# Patient Record
Sex: Female | Born: 1970 | Race: White | Hispanic: No | Marital: Single | Smoking: Never smoker
Health system: Southern US, Community
[De-identification: ages and names within clinical notes are randomized; demographics above are authoritative.]

## PROBLEM LIST (undated history)

## (undated) DIAGNOSIS — H9190 Unspecified hearing loss, unspecified ear: Secondary | ICD-10-CM

## (undated) DIAGNOSIS — E119 Type 2 diabetes mellitus without complications: Secondary | ICD-10-CM

## (undated) DIAGNOSIS — N289 Disorder of kidney and ureter, unspecified: Secondary | ICD-10-CM

---

## 2008-10-26 ENCOUNTER — Emergency Department (HOSPITAL_COMMUNITY): Admission: EM | Admit: 2008-10-26 | Discharge: 2008-10-27 | Payer: Self-pay | Admitting: Emergency Medicine

## 2008-11-14 ENCOUNTER — Emergency Department (HOSPITAL_COMMUNITY): Admission: EM | Admit: 2008-11-14 | Discharge: 2008-11-14 | Payer: Self-pay | Admitting: Emergency Medicine

## 2009-02-10 ENCOUNTER — Ambulatory Visit: Payer: Self-pay | Admitting: Internal Medicine

## 2009-02-10 ENCOUNTER — Inpatient Hospital Stay (HOSPITAL_COMMUNITY): Admission: EM | Admit: 2009-02-10 | Discharge: 2009-02-15 | Payer: Self-pay | Admitting: Emergency Medicine

## 2009-02-11 ENCOUNTER — Encounter (INDEPENDENT_AMBULATORY_CARE_PROVIDER_SITE_OTHER): Payer: Self-pay | Admitting: Internal Medicine

## 2009-02-16 ENCOUNTER — Telehealth: Payer: Self-pay | Admitting: Internal Medicine

## 2009-02-19 ENCOUNTER — Telehealth: Payer: Self-pay | Admitting: Internal Medicine

## 2009-02-22 ENCOUNTER — Ambulatory Visit: Payer: Self-pay | Admitting: Internal Medicine

## 2009-02-22 DIAGNOSIS — I10 Essential (primary) hypertension: Secondary | ICD-10-CM

## 2009-02-22 DIAGNOSIS — I5032 Chronic diastolic (congestive) heart failure: Secondary | ICD-10-CM | POA: Insufficient documentation

## 2009-02-22 DIAGNOSIS — K5900 Constipation, unspecified: Secondary | ICD-10-CM | POA: Insufficient documentation

## 2009-02-22 DIAGNOSIS — I319 Disease of pericardium, unspecified: Secondary | ICD-10-CM | POA: Insufficient documentation

## 2009-02-22 DIAGNOSIS — E1129 Type 2 diabetes mellitus with other diabetic kidney complication: Secondary | ICD-10-CM | POA: Insufficient documentation

## 2009-02-23 ENCOUNTER — Telehealth: Payer: Self-pay | Admitting: Internal Medicine

## 2009-02-25 ENCOUNTER — Encounter: Payer: Self-pay | Admitting: Internal Medicine

## 2009-03-04 ENCOUNTER — Telehealth: Payer: Self-pay | Admitting: Internal Medicine

## 2009-03-04 LAB — CONVERTED CEMR LAB
BUN: 47 mg/dL — ABNORMAL HIGH (ref 6–23)
Calcium: 9.1 mg/dL (ref 8.4–10.5)
Chloride: 99 meq/L (ref 96–112)
Creatinine, Ser: 2.4 mg/dL — ABNORMAL HIGH (ref 0.4–1.2)
GFR calc non Af Amer: 23.98 mL/min (ref >60)
Glucose, Bld: 293 mg/dL — ABNORMAL HIGH (ref 70–99)
Pro B Natriuretic peptide (BNP): 121 pg/mL — ABNORMAL HIGH (ref 0.0–100.0)

## 2009-03-05 ENCOUNTER — Inpatient Hospital Stay (HOSPITAL_COMMUNITY): Admission: EM | Admit: 2009-03-05 | Discharge: 2009-03-11 | Payer: Self-pay | Admitting: Emergency Medicine

## 2009-03-05 ENCOUNTER — Emergency Department (HOSPITAL_COMMUNITY): Admission: EM | Admit: 2009-03-05 | Discharge: 2009-03-05 | Payer: Self-pay | Admitting: Emergency Medicine

## 2009-03-07 ENCOUNTER — Encounter: Payer: Self-pay | Admitting: Internal Medicine

## 2009-03-10 ENCOUNTER — Encounter (INDEPENDENT_AMBULATORY_CARE_PROVIDER_SITE_OTHER): Payer: Self-pay | Admitting: Orthopedic Surgery

## 2009-03-10 ENCOUNTER — Encounter: Payer: Self-pay | Admitting: Internal Medicine

## 2009-03-13 ENCOUNTER — Emergency Department (HOSPITAL_COMMUNITY): Admission: EM | Admit: 2009-03-13 | Discharge: 2009-03-13 | Payer: Self-pay | Admitting: Emergency Medicine

## 2009-04-02 ENCOUNTER — Inpatient Hospital Stay (HOSPITAL_COMMUNITY): Admission: EM | Admit: 2009-04-02 | Discharge: 2009-04-08 | Payer: Self-pay | Admitting: Emergency Medicine

## 2009-04-03 ENCOUNTER — Encounter (INDEPENDENT_AMBULATORY_CARE_PROVIDER_SITE_OTHER): Payer: Self-pay

## 2009-04-06 ENCOUNTER — Ambulatory Visit: Payer: Self-pay | Admitting: Physical Medicine & Rehabilitation

## 2009-07-28 ENCOUNTER — Ambulatory Visit: Payer: Self-pay | Admitting: Internal Medicine

## 2009-08-02 ENCOUNTER — Ambulatory Visit: Payer: Self-pay | Admitting: Internal Medicine

## 2009-08-05 ENCOUNTER — Encounter: Payer: Self-pay | Admitting: Internal Medicine

## 2009-08-12 ENCOUNTER — Encounter: Payer: Self-pay | Admitting: Internal Medicine

## 2010-04-21 IMAGING — CR DG FOOT COMPLETE 3+V*L*
3 series · 3 of 3 positions shown · non-contrast
Comparison: 03/05/2009

CLINICAL DATA: Drainage post surgery

LEFT FOOT - COMPLETE 3+ VIEW

[t foot ap left]
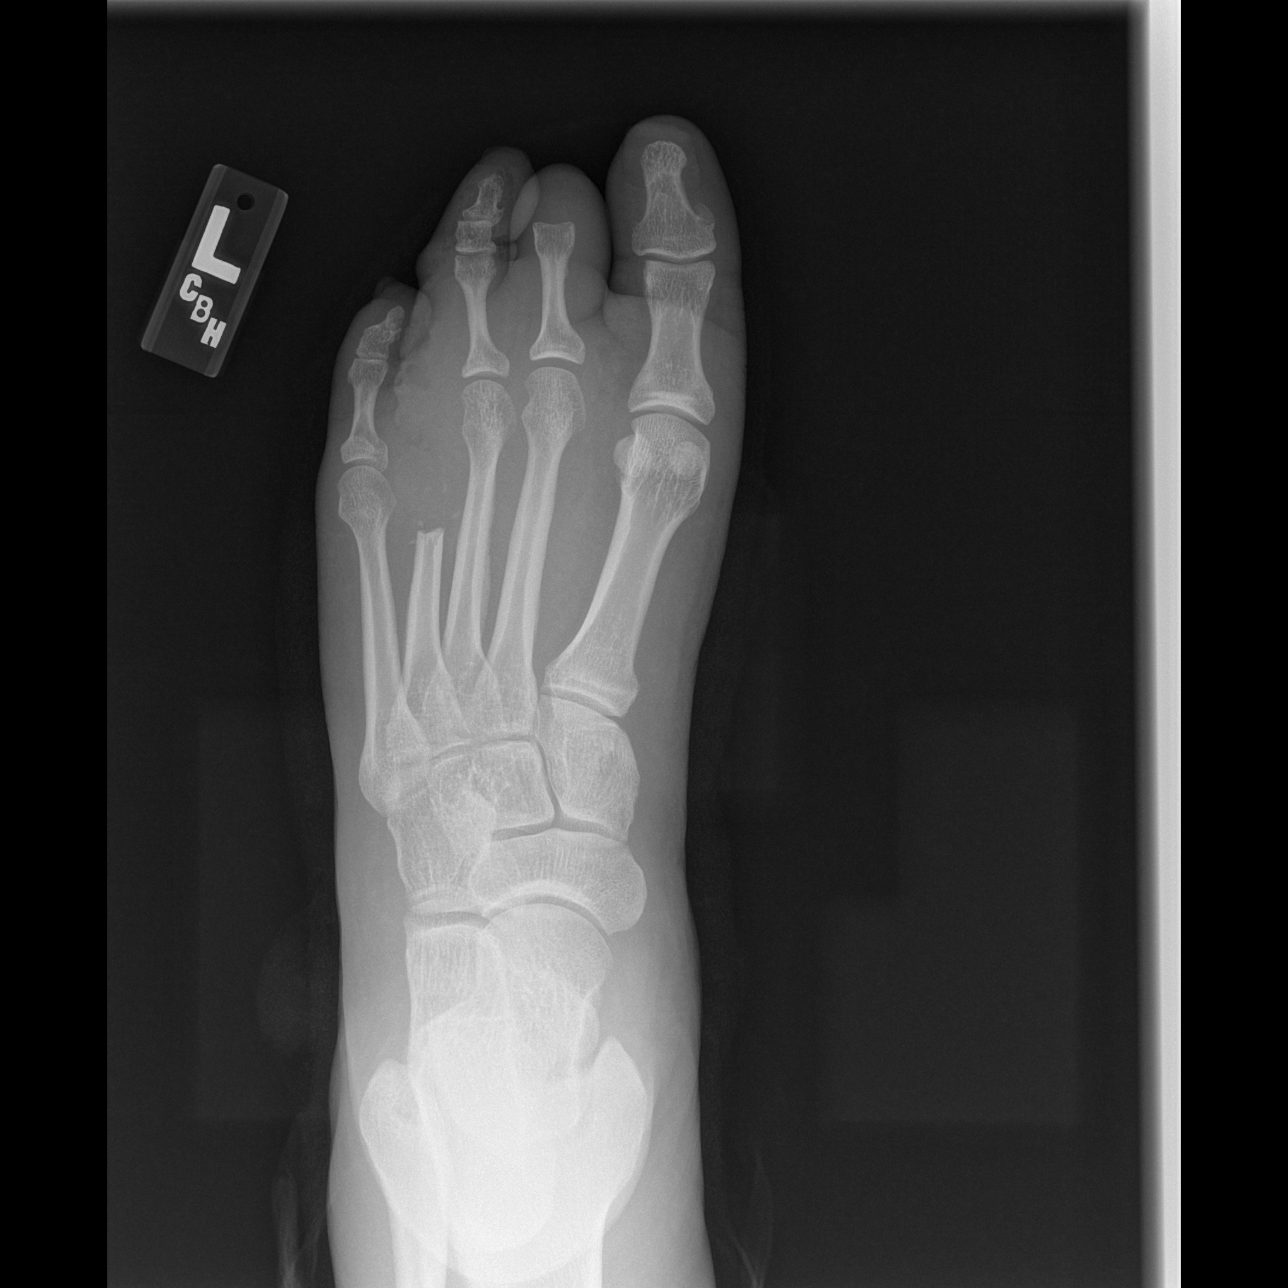

[t foot oblique left]
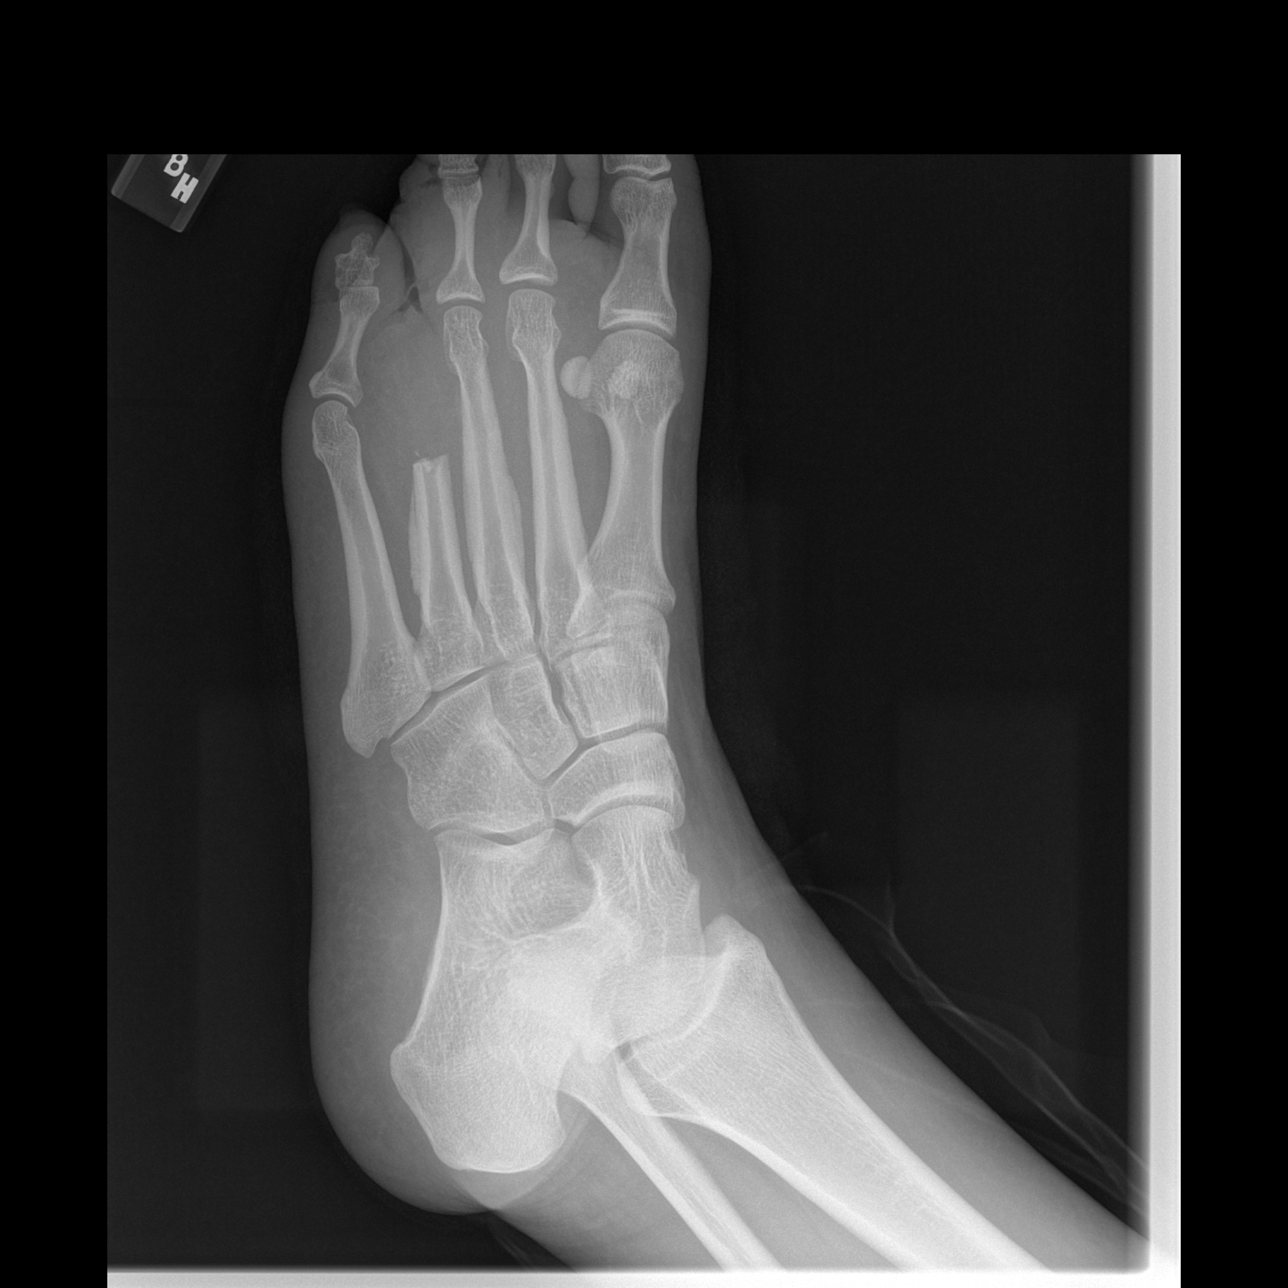

[t foot lat left]
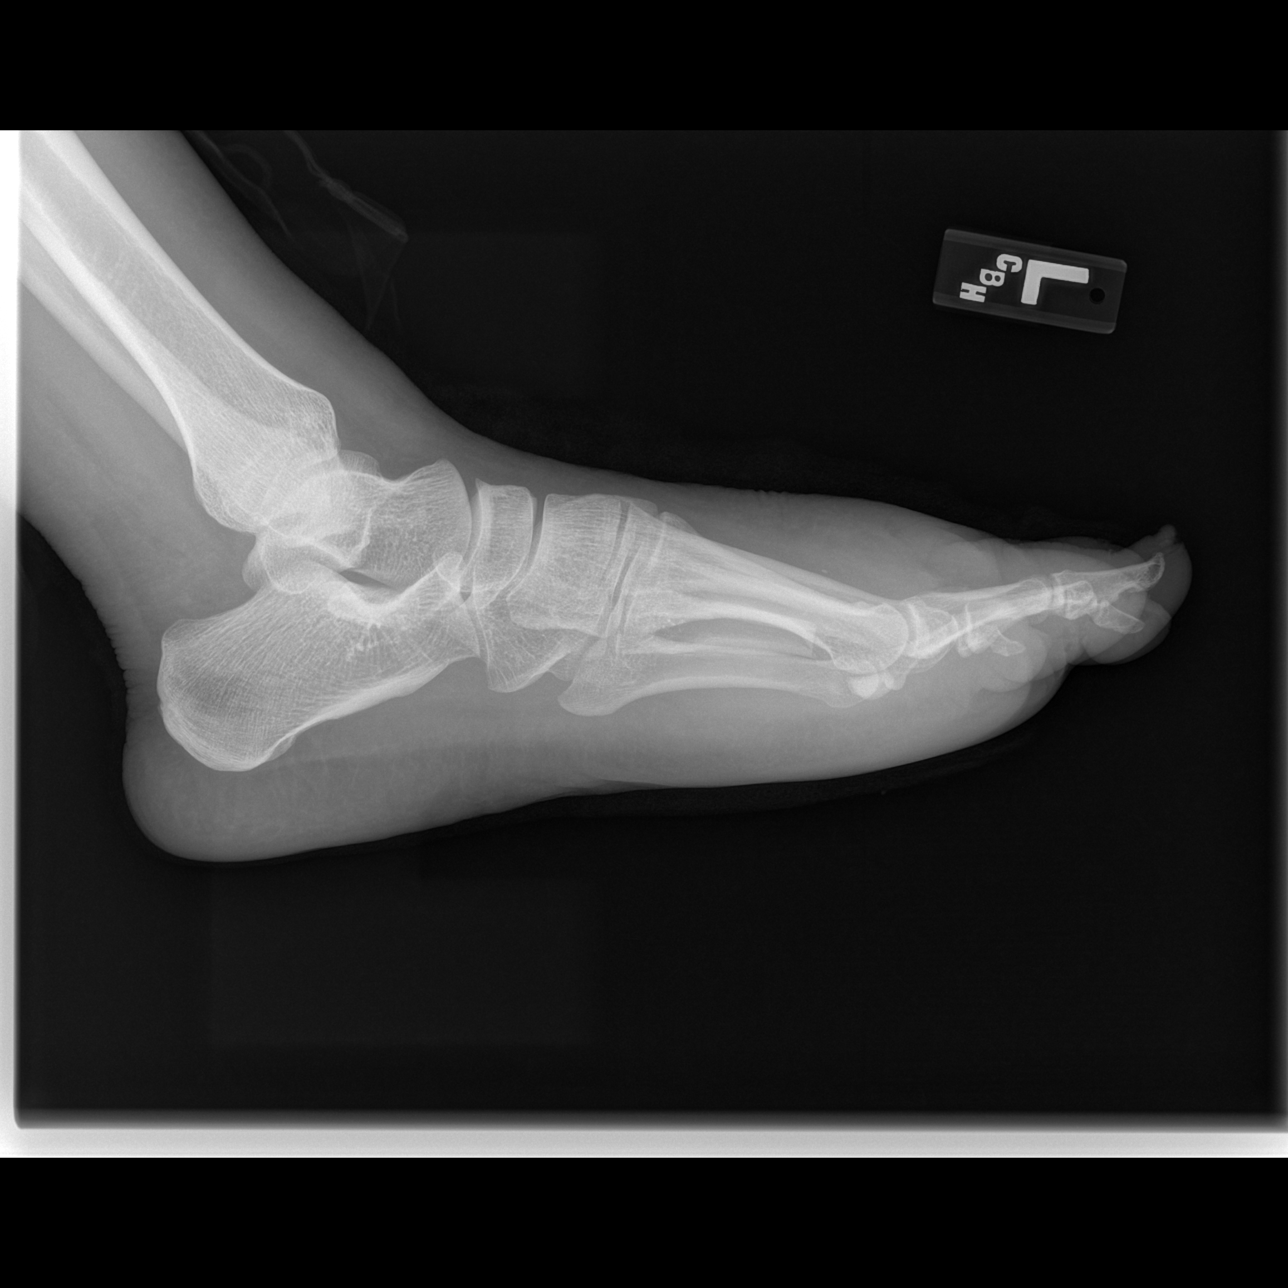

[3 of 3 positions shown; findings below may reference images not displayed]

FINDINGS: There is been interval amputation across the distal shaft
of the fourth metatarsal.  There are tiny osseous fragments distal
to the resection site.  No subcutaneous gas or foreign body.  There
is regional soft tissue swelling.  Normal alignment and
mineralization.  Stable changes of amputation across the second toe
PIP.
IMPRESSION: 1.  Amputation across the distal fourth metatarsal shaft without
subcutaneous gas or other apparent complication.

## 2010-04-25 IMAGING — US US RENAL
1 series · 14 of 23 positions shown · non-contrast
Comparison: None

CLINICAL DATA: Elevated BUN and creatinine.

RENAL/URINARY TRACT ULTRASOUND COMPLETE

[Series 1: us renal · 0.32mm/px · 14 of 23 slices shown]
[im 1/23]
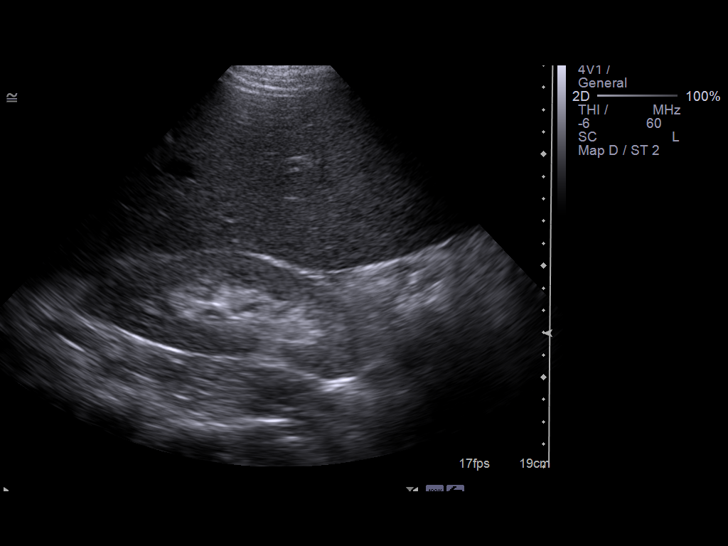
[im 3/23]
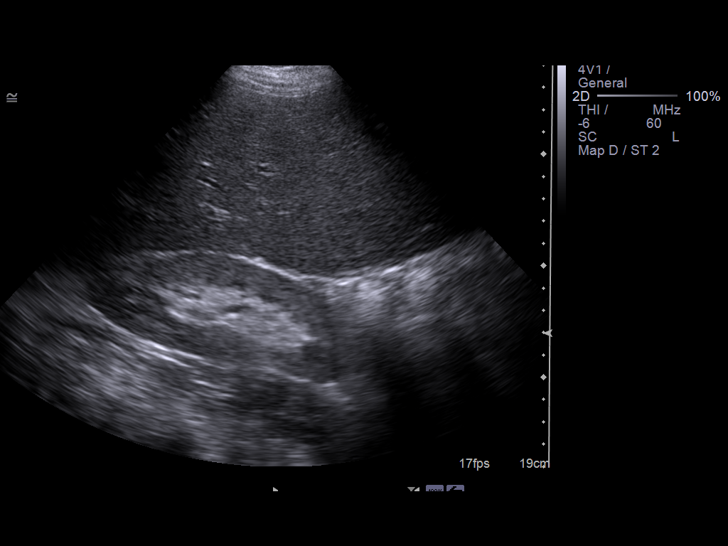
[im 5/23]
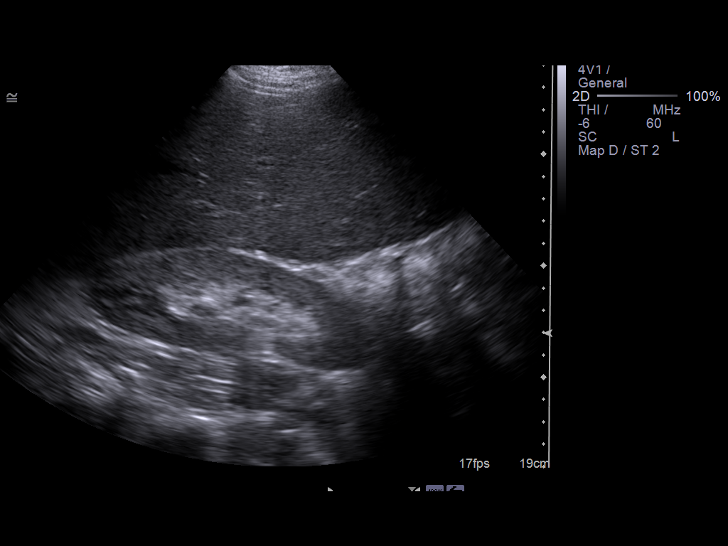
[im 6/23]
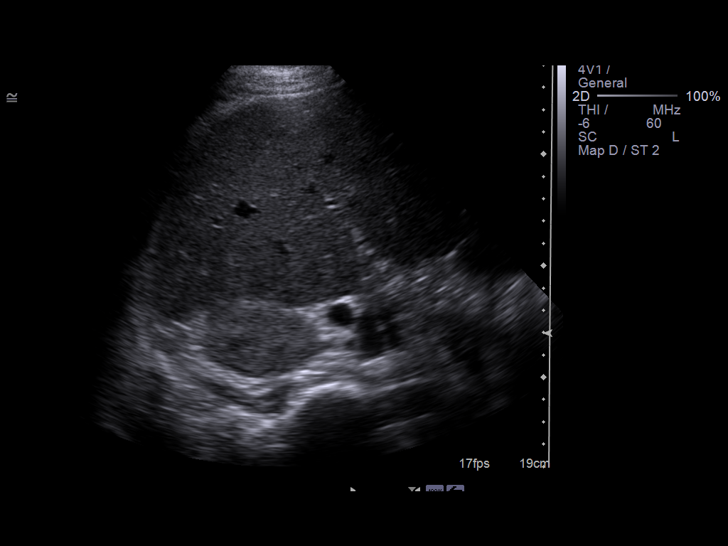
[im 8/23]
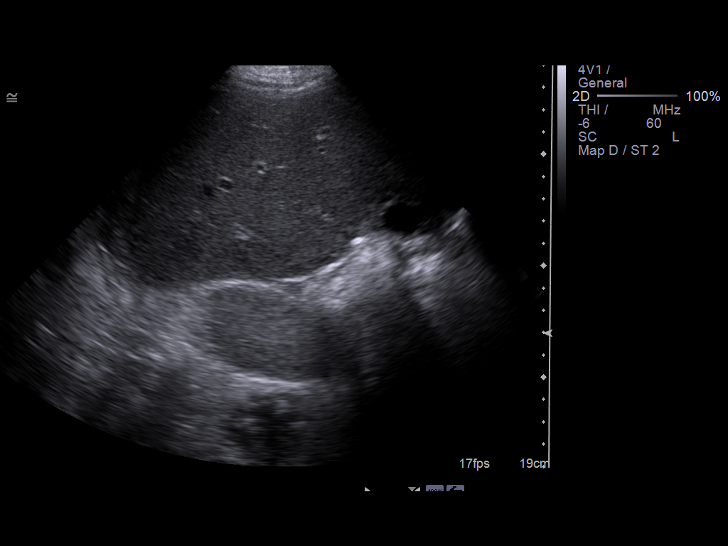
[im 10/23]
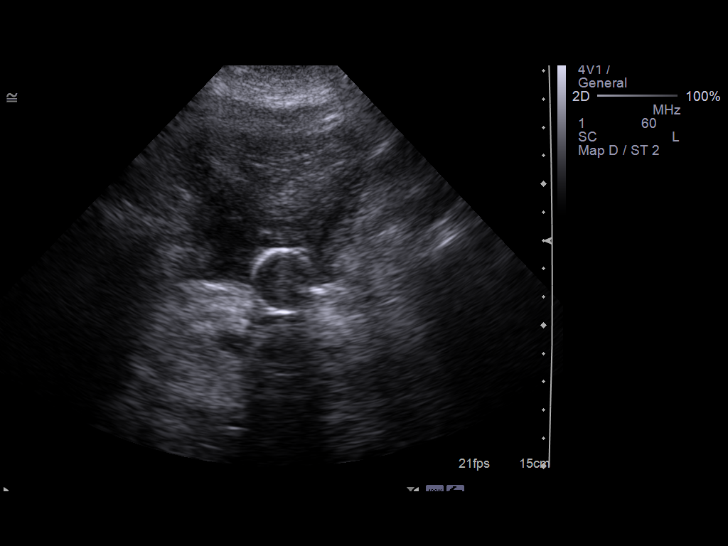
[im 11/23]
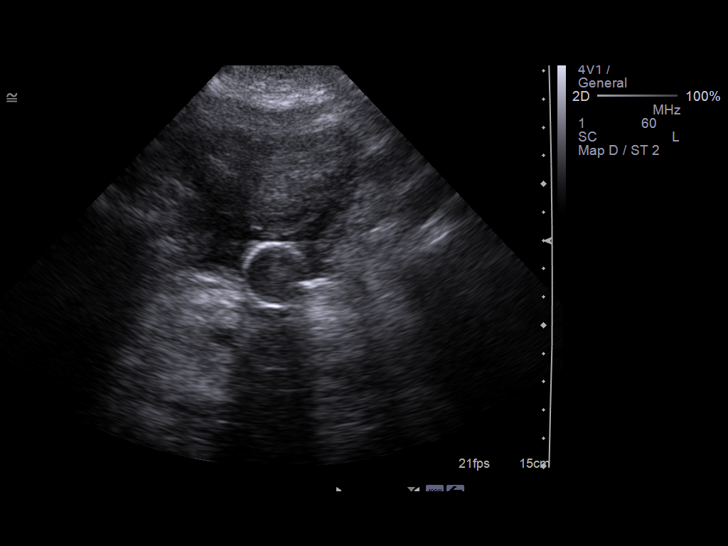
[im 13/23]
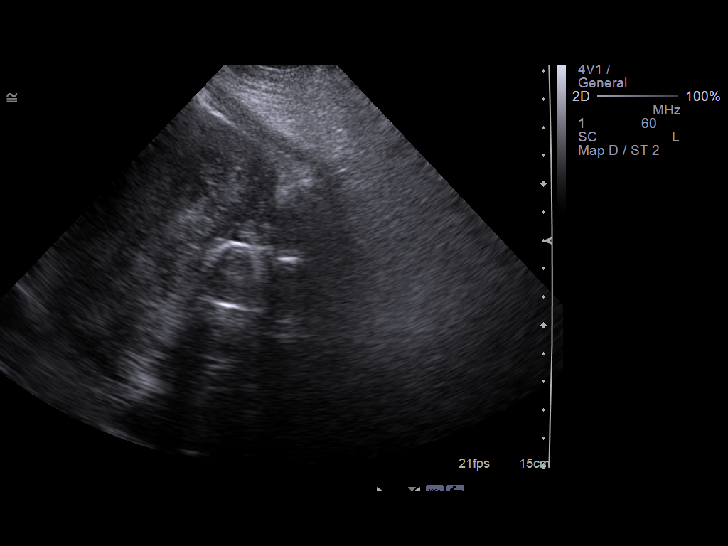
[im 14/23]
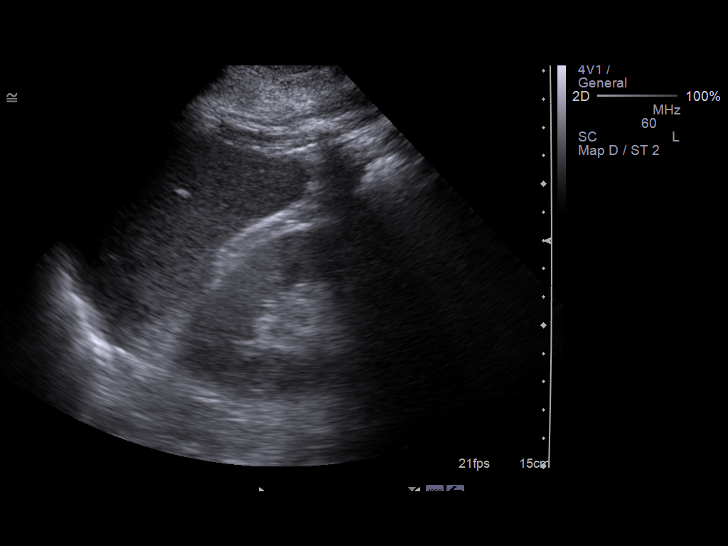
[im 16/23]
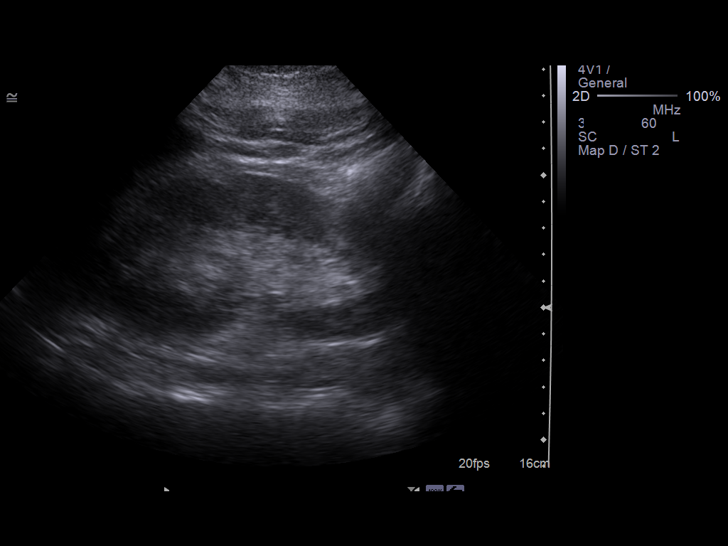
[im 18/23]
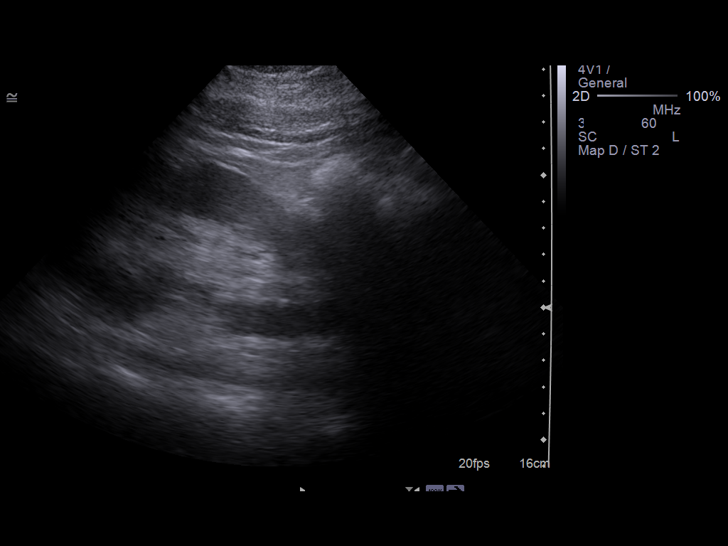
[im 19/23]
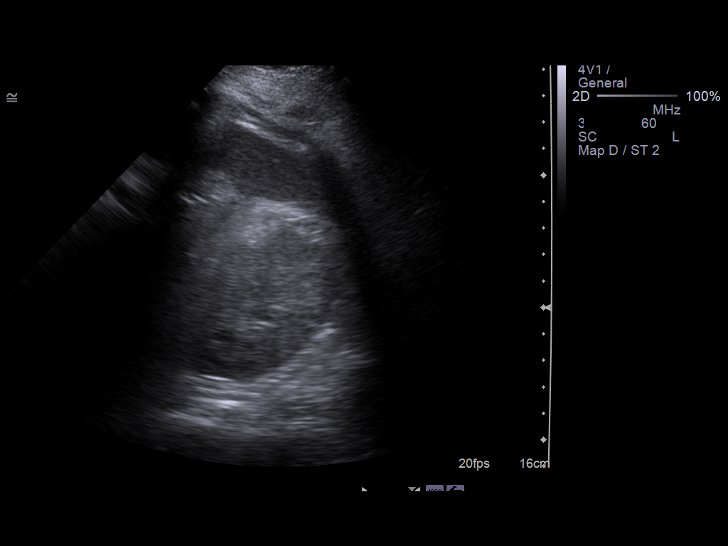
[im 21/23]
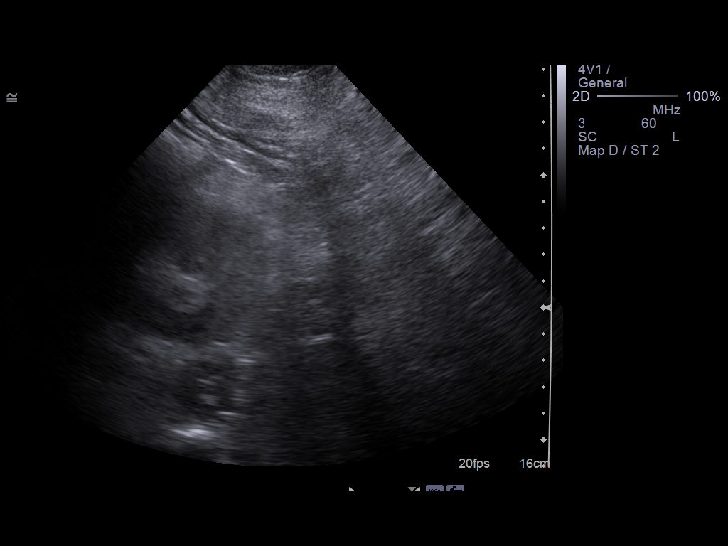
[im 23/23]
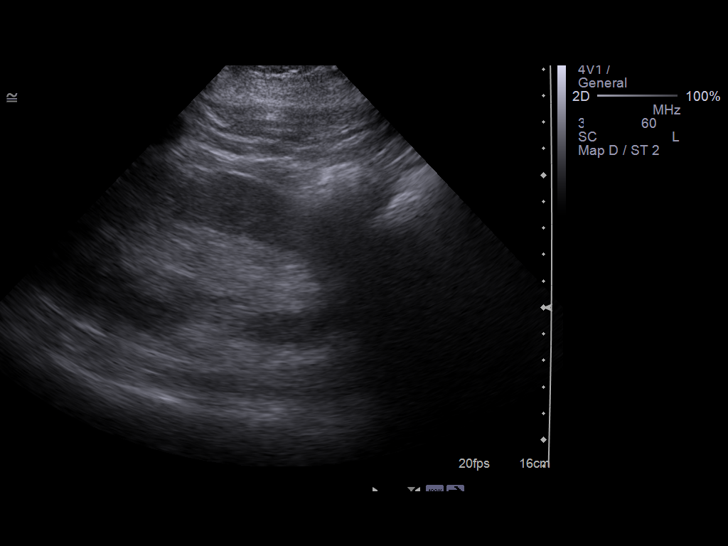

[14 of 23 positions shown; findings below may reference images not displayed]

FINDINGS: Right Kidney:  12.5 cm in length.  Normal renal cortical thickness
with slight diffuse increased echogenicity which may suggest
medical renal disease. No focal lesions or hydronephrosis.

Left Kidney:  12.5 cm in length.  Normal renal cortical thickness
and slight increased echogenicity which may suggest medical renal
disease.  No focal lesions or hydronephrosis.

Bladder:  Decompressed by a Foley catheter.
IMPRESSION: 1.  Normal renal size and renal cortical thickness.
2.  Slight increased echogenicity of both kidneys may suggest
medical renal disease.
3.  No focal lesions or hydronephrosis.

## 2010-05-31 NOTE — Miscellaneous (Signed)
Summary: [facility] Orders  [facility] Orders   Imported By: Roderic Ovens 08/10/2009 14:15:01  _____________________________________________________________________  External Attachment:    Type:   Image     Comment:   External Document

## 2010-05-31 NOTE — Miscellaneous (Signed)
Summary: Plan/[facility]  Plan/[facility]   Imported By: Lester [place] 08/02/2009 07:52:51  _____________________________________________________________________  External Attachment:    Type:   Image     Comment:   External Document

## 2010-05-31 NOTE — Miscellaneous (Signed)
Summary: [facility] Orders  [facility]   Imported By: Roderic Ovens 08/25/2009 16:39:34  _____________________________________________________________________  External Attachment:    Type:   Image     Comment:   External Document

## 2010-05-31 NOTE — Miscellaneous (Signed)
Summary: Plan of Care & Treatment/[facility]  Plan of Care & Treatment/[facility]   Imported By: Sherian Rein 08/04/2009 10:29:26  _____________________________________________________________________  External Attachment:    Type:   Image     Comment:   External Document

## 2010-08-02 LAB — URINALYSIS, ROUTINE W REFLEX MICROSCOPIC
Bilirubin Urine: NEGATIVE
Nitrite: NEGATIVE
Specific Gravity, Urine: 1.023 (ref 1.005–1.030)
pH: 5.5 (ref 5.0–8.0)

## 2010-08-02 LAB — GLUCOSE, CAPILLARY
Glucose-Capillary: 100 mg/dL — ABNORMAL HIGH (ref 70–99)
Glucose-Capillary: 107 mg/dL — ABNORMAL HIGH (ref 70–99)
Glucose-Capillary: 107 mg/dL — ABNORMAL HIGH (ref 70–99)
Glucose-Capillary: 111 mg/dL — ABNORMAL HIGH (ref 70–99)
Glucose-Capillary: 113 mg/dL — ABNORMAL HIGH (ref 70–99)
Glucose-Capillary: 120 mg/dL — ABNORMAL HIGH (ref 70–99)
Glucose-Capillary: 124 mg/dL — ABNORMAL HIGH (ref 70–99)
Glucose-Capillary: 131 mg/dL — ABNORMAL HIGH (ref 70–99)
Glucose-Capillary: 137 mg/dL — ABNORMAL HIGH (ref 70–99)
Glucose-Capillary: 150 mg/dL — ABNORMAL HIGH (ref 70–99)
Glucose-Capillary: 158 mg/dL — ABNORMAL HIGH (ref 70–99)
Glucose-Capillary: 161 mg/dL — ABNORMAL HIGH (ref 70–99)
Glucose-Capillary: 162 mg/dL — ABNORMAL HIGH (ref 70–99)
Glucose-Capillary: 165 mg/dL — ABNORMAL HIGH (ref 70–99)
Glucose-Capillary: 184 mg/dL — ABNORMAL HIGH (ref 70–99)
Glucose-Capillary: 51 mg/dL — ABNORMAL LOW (ref 70–99)
Glucose-Capillary: 85 mg/dL (ref 70–99)
Glucose-Capillary: 95 mg/dL (ref 70–99)

## 2010-08-02 LAB — POCT I-STAT, CHEM 8
Calcium, Ion: 1 mmol/L — ABNORMAL LOW (ref 1.12–1.32)
Chloride: 105 mEq/L (ref 96–112)

## 2010-08-02 LAB — RETICULOCYTES
RBC.: 2.97 MIL/uL — ABNORMAL LOW (ref 3.87–5.11)
Retic Count, Absolute: 17.8 10*3/uL — ABNORMAL LOW (ref 19.0–186.0)

## 2010-08-02 LAB — CBC
HCT: 23 % — ABNORMAL LOW (ref 36.0–46.0)
HCT: 24.7 % — ABNORMAL LOW (ref 36.0–46.0)
HCT: 25 % — ABNORMAL LOW (ref 36.0–46.0)
HCT: 25.6 % — ABNORMAL LOW (ref 36.0–46.0)
HCT: 26 % — ABNORMAL LOW (ref 36.0–46.0)
Hemoglobin: 7.8 g/dL — ABNORMAL LOW (ref 12.0–15.0)
Hemoglobin: 8.4 g/dL — ABNORMAL LOW (ref 12.0–15.0)
Hemoglobin: 8.5 g/dL — ABNORMAL LOW (ref 12.0–15.0)
Hemoglobin: 8.5 g/dL — ABNORMAL LOW (ref 12.0–15.0)
MCHC: 33.1 g/dL (ref 30.0–36.0)
MCHC: 33.1 g/dL (ref 30.0–36.0)
MCHC: 33.8 g/dL (ref 30.0–36.0)
MCHC: 33.8 g/dL (ref 30.0–36.0)
MCHC: 34.1 g/dL (ref 30.0–36.0)
MCHC: 34.5 g/dL (ref 30.0–36.0)
MCV: 79.8 fL (ref 78.0–100.0)
MCV: 80.4 fL (ref 78.0–100.0)
MCV: 80.8 fL (ref 78.0–100.0)
MCV: 81.7 fL (ref 78.0–100.0)
MCV: 83.7 fL (ref 78.0–100.0)
MCV: 84.5 fL (ref 78.0–100.0)
MCV: 84.7 fL (ref 78.0–100.0)
Platelets: 379 10*3/uL (ref 150–400)
Platelets: 390 10*3/uL (ref 150–400)
RBC: 2.85 MIL/uL — ABNORMAL LOW (ref 3.87–5.11)
RBC: 2.97 MIL/uL — ABNORMAL LOW (ref 3.87–5.11)
RBC: 3.03 MIL/uL — ABNORMAL LOW (ref 3.87–5.11)
RBC: 3.1 MIL/uL — ABNORMAL LOW (ref 3.87–5.11)
RBC: 3.41 MIL/uL — ABNORMAL LOW (ref 3.87–5.11)
RDW: 14.8 % (ref 11.5–15.5)
RDW: 14.9 % (ref 11.5–15.5)
RDW: 14.9 % (ref 11.5–15.5)
RDW: 14.9 % (ref 11.5–15.5)
WBC: 13.8 10*3/uL — ABNORMAL HIGH (ref 4.0–10.5)
WBC: 14.6 10*3/uL — ABNORMAL HIGH (ref 4.0–10.5)
WBC: 16 10*3/uL — ABNORMAL HIGH (ref 4.0–10.5)
WBC: 22.9 10*3/uL — ABNORMAL HIGH (ref 4.0–10.5)

## 2010-08-02 LAB — RENAL FUNCTION PANEL
Albumin: 1.8 g/dL — ABNORMAL LOW (ref 3.5–5.2)
CO2: 22 mEq/L (ref 19–32)
CO2: 22 mEq/L (ref 19–32)
CO2: 22 mEq/L (ref 19–32)
Calcium: 6.9 mg/dL — ABNORMAL LOW (ref 8.4–10.5)
Calcium: 7 mg/dL — ABNORMAL LOW (ref 8.4–10.5)
Chloride: 110 mEq/L (ref 96–112)
Chloride: 111 mEq/L (ref 96–112)
Chloride: 113 mEq/L — ABNORMAL HIGH (ref 96–112)
Creatinine, Ser: 2.99 mg/dL — ABNORMAL HIGH (ref 0.4–1.2)
GFR calc Af Amer: 19 mL/min — ABNORMAL LOW (ref >60)
GFR calc Af Amer: 21 mL/min — ABNORMAL LOW (ref >60)
GFR calc Af Amer: 22 mL/min — ABNORMAL LOW (ref >60)
GFR calc non Af Amer: 16 mL/min — ABNORMAL LOW (ref >60)
GFR calc non Af Amer: 18 mL/min — ABNORMAL LOW (ref >60)
GFR calc non Af Amer: 18 mL/min — ABNORMAL LOW (ref >60)
Glucose, Bld: 102 mg/dL — ABNORMAL HIGH (ref 70–99)
Glucose, Bld: 116 mg/dL — ABNORMAL HIGH (ref 70–99)
Phosphorus: 4.1 mg/dL (ref 2.3–4.6)
Potassium: 4.4 mEq/L (ref 3.5–5.1)
Sodium: 142 mEq/L (ref 135–145)

## 2010-08-02 LAB — FOLATE: Folate: 6.7 ng/mL

## 2010-08-02 LAB — TSH: TSH: 26.166 u[IU]/mL — ABNORMAL HIGH (ref 0.350–4.500)

## 2010-08-02 LAB — BASIC METABOLIC PANEL
CO2: 22 mEq/L (ref 19–32)
Chloride: 109 mEq/L (ref 96–112)
GFR calc Af Amer: 18 mL/min — ABNORMAL LOW (ref >60)
GFR calc non Af Amer: 15 mL/min — ABNORMAL LOW (ref >60)
Glucose, Bld: 166 mg/dL — ABNORMAL HIGH (ref 70–99)
Potassium: 3.6 mEq/L (ref 3.5–5.1)
Potassium: 3.9 mEq/L (ref 3.5–5.1)
Sodium: 134 mEq/L — ABNORMAL LOW (ref 135–145)
Sodium: 139 mEq/L (ref 135–145)

## 2010-08-02 LAB — COMPREHENSIVE METABOLIC PANEL
ALT: 13 U/L (ref 0–35)
AST: 16 U/L (ref 0–37)
BUN: 52 mg/dL — ABNORMAL HIGH (ref 6–23)
CO2: 22 mEq/L (ref 19–32)
CO2: 23 mEq/L (ref 19–32)
Calcium: 7.3 mg/dL — ABNORMAL LOW (ref 8.4–10.5)
Calcium: 7.5 mg/dL — ABNORMAL LOW (ref 8.4–10.5)
Creatinine, Ser: 3.34 mg/dL — ABNORMAL HIGH (ref 0.4–1.2)
GFR calc Af Amer: 19 mL/min — ABNORMAL LOW (ref >60)
GFR calc non Af Amer: 15 mL/min — ABNORMAL LOW (ref >60)
GFR calc non Af Amer: 19 mL/min — ABNORMAL LOW (ref >60)
Glucose, Bld: 123 mg/dL — ABNORMAL HIGH (ref 70–99)
Sodium: 135 mEq/L (ref 135–145)

## 2010-08-02 LAB — DIFFERENTIAL
Basophils Absolute: 0 10*3/uL (ref 0.0–0.1)
Basophils Relative: 0 % (ref 0–1)
Lymphocytes Relative: 4 % — ABNORMAL LOW (ref 12–46)
Lymphs Abs: 1 10*3/uL (ref 0.7–4.0)
Monocytes Absolute: 1.6 10*3/uL — ABNORMAL HIGH (ref 0.1–1.0)
Neutro Abs: 20.2 10*3/uL — ABNORMAL HIGH (ref 1.7–7.7)
Neutrophils Relative %: 88 % — ABNORMAL HIGH (ref 43–77)

## 2010-08-02 LAB — CROSSMATCH: Antibody Screen: NEGATIVE

## 2010-08-02 LAB — CULTURE, BLOOD (ROUTINE X 2)
Culture: NO GROWTH
Culture: NO GROWTH

## 2010-08-02 LAB — HEPATITIS PANEL, ACUTE
HCV Ab: NEGATIVE
Hep A IgM: NEGATIVE
Hep B C IgM: NEGATIVE

## 2010-08-02 LAB — PROTIME-INR
INR: 1.46 (ref 0.00–1.49)
Prothrombin Time: 17.4 seconds — ABNORMAL HIGH (ref 11.6–15.2)

## 2010-08-02 LAB — PHOSPHORUS: Phosphorus: 5.3 mg/dL — ABNORMAL HIGH (ref 2.3–4.6)

## 2010-08-02 LAB — SEDIMENTATION RATE: Sed Rate: 100 mm/hr — ABNORMAL HIGH (ref 0–22)

## 2010-08-02 LAB — FERRITIN: Ferritin: 109 ng/mL (ref 10–291)

## 2010-08-02 LAB — URINE MICROSCOPIC-ADD ON

## 2010-08-02 LAB — IRON AND TIBC: Iron: <10 ug/dL — ABNORMAL LOW (ref 42–135)

## 2010-08-02 LAB — PTH, INTACT AND CALCIUM: Calcium, Total (PTH): 6.6 mg/dL — ABNORMAL LOW (ref 8.4–10.5)

## 2010-08-02 LAB — ERYTHROPOIETIN: Erythropoietin: 130 m[IU]/mL — ABNORMAL HIGH (ref 2.6–34.0)

## 2010-08-02 LAB — VANCOMYCIN, TROUGH: Vancomycin Tr: 19.1 ug/mL (ref 10.0–20.0)

## 2010-08-02 LAB — VITAMIN B12: Vitamin B-12: 431 pg/mL (ref 211–911)

## 2010-08-03 LAB — BASIC METABOLIC PANEL
BUN: 28 mg/dL — ABNORMAL HIGH (ref 6–23)
BUN: 33 mg/dL — ABNORMAL HIGH (ref 6–23)
BUN: 37 mg/dL — ABNORMAL HIGH (ref 6–23)
CO2: 26 mEq/L (ref 19–32)
CO2: 26 mEq/L (ref 19–32)
Calcium: 8.1 mg/dL — ABNORMAL LOW (ref 8.4–10.5)
Calcium: 8.5 mg/dL (ref 8.4–10.5)
Calcium: 8.7 mg/dL (ref 8.4–10.5)
Chloride: 100 mEq/L (ref 96–112)
Chloride: 105 mEq/L (ref 96–112)
Chloride: 106 mEq/L (ref 96–112)
Creatinine, Ser: 2.09 mg/dL — ABNORMAL HIGH (ref 0.4–1.2)
Creatinine, Ser: 2.15 mg/dL — ABNORMAL HIGH (ref 0.4–1.2)
Creatinine, Ser: 2.24 mg/dL — ABNORMAL HIGH (ref 0.4–1.2)
Creatinine, Ser: 2.25 mg/dL — ABNORMAL HIGH (ref 0.4–1.2)
GFR calc Af Amer: 30 mL/min — ABNORMAL LOW (ref >60)
GFR calc Af Amer: 32 mL/min — ABNORMAL LOW (ref >60)
GFR calc non Af Amer: 24 mL/min — ABNORMAL LOW (ref >60)
GFR calc non Af Amer: 26 mL/min — ABNORMAL LOW (ref >60)
GFR calc non Af Amer: 26 mL/min — ABNORMAL LOW (ref >60)
GFR calc non Af Amer: 27 mL/min — ABNORMAL LOW (ref >60)
Glucose, Bld: 109 mg/dL — ABNORMAL HIGH (ref 70–99)
Glucose, Bld: 127 mg/dL — ABNORMAL HIGH (ref 70–99)
Glucose, Bld: 223 mg/dL — ABNORMAL HIGH (ref 70–99)
Glucose, Bld: 274 mg/dL — ABNORMAL HIGH (ref 70–99)
Glucose, Bld: 286 mg/dL — ABNORMAL HIGH (ref 70–99)
Potassium: 3.6 mEq/L (ref 3.5–5.1)
Potassium: 3.9 mEq/L (ref 3.5–5.1)
Potassium: 4 mEq/L (ref 3.5–5.1)
Sodium: 137 mEq/L (ref 135–145)
Sodium: 138 mEq/L (ref 135–145)
Sodium: 138 mEq/L (ref 135–145)
Sodium: 138 mEq/L (ref 135–145)
Sodium: 139 mEq/L (ref 135–145)

## 2010-08-03 LAB — PHOSPHORUS
Phosphorus: 3.9 mg/dL (ref 2.3–4.6)
Phosphorus: 4 mg/dL (ref 2.3–4.6)

## 2010-08-03 LAB — ABO/RH: ABO/RH(D): A POS

## 2010-08-03 LAB — CBC
HCT: 25 % — ABNORMAL LOW (ref 36.0–46.0)
HCT: 27.6 % — ABNORMAL LOW (ref 36.0–46.0)
Hemoglobin: 8.1 g/dL — ABNORMAL LOW (ref 12.0–15.0)
Hemoglobin: 8.4 g/dL — ABNORMAL LOW (ref 12.0–15.0)
Hemoglobin: 9.4 g/dL — ABNORMAL LOW (ref 12.0–15.0)
MCHC: 33.6 g/dL (ref 30.0–36.0)
MCHC: 33.8 g/dL (ref 30.0–36.0)
MCHC: 34.2 g/dL (ref 30.0–36.0)
MCV: 81.7 fL (ref 78.0–100.0)
MCV: 81.8 fL (ref 78.0–100.0)
MCV: 82.4 fL (ref 78.0–100.0)
Platelets: 287 10*3/uL (ref 150–400)
Platelets: 300 10*3/uL (ref 150–400)
Platelets: 301 10*3/uL (ref 150–400)
Platelets: 350 10*3/uL (ref 150–400)
RBC: 2.93 MIL/uL — ABNORMAL LOW (ref 3.87–5.11)
RBC: 3.88 MIL/uL (ref 3.87–5.11)
RDW: 14 % (ref 11.5–15.5)
RDW: 14.1 % (ref 11.5–15.5)
RDW: 14.2 % (ref 11.5–15.5)
RDW: 14.2 % (ref 11.5–15.5)
RDW: 14.3 % (ref 11.5–15.5)
WBC: 10 10*3/uL (ref 4.0–10.5)
WBC: 11 10*3/uL — ABNORMAL HIGH (ref 4.0–10.5)
WBC: 11.9 10*3/uL — ABNORMAL HIGH (ref 4.0–10.5)
WBC: 14.7 10*3/uL — ABNORMAL HIGH (ref 4.0–10.5)
WBC: 15.1 10*3/uL — ABNORMAL HIGH (ref 4.0–10.5)

## 2010-08-03 LAB — GLUCOSE, CAPILLARY
Glucose-Capillary: 134 mg/dL — ABNORMAL HIGH (ref 70–99)
Glucose-Capillary: 167 mg/dL — ABNORMAL HIGH (ref 70–99)
Glucose-Capillary: 168 mg/dL — ABNORMAL HIGH (ref 70–99)
Glucose-Capillary: 188 mg/dL — ABNORMAL HIGH (ref 70–99)
Glucose-Capillary: 205 mg/dL — ABNORMAL HIGH (ref 70–99)
Glucose-Capillary: 210 mg/dL — ABNORMAL HIGH (ref 70–99)
Glucose-Capillary: 214 mg/dL — ABNORMAL HIGH (ref 70–99)
Glucose-Capillary: 236 mg/dL — ABNORMAL HIGH (ref 70–99)
Glucose-Capillary: 281 mg/dL — ABNORMAL HIGH (ref 70–99)
Glucose-Capillary: 281 mg/dL — ABNORMAL HIGH (ref 70–99)
Glucose-Capillary: 296 mg/dL — ABNORMAL HIGH (ref 70–99)
Glucose-Capillary: 301 mg/dL — ABNORMAL HIGH (ref 70–99)
Glucose-Capillary: 314 mg/dL — ABNORMAL HIGH (ref 70–99)

## 2010-08-03 LAB — DIFFERENTIAL
Basophils Absolute: 0.1 10*3/uL (ref 0.0–0.1)
Basophils Absolute: 0.1 10*3/uL (ref 0.0–0.1)
Basophils Relative: 1 % (ref 0–1)
Basophils Relative: 1 % (ref 0–1)
Eosinophils Absolute: 0.3 10*3/uL (ref 0.0–0.7)
Eosinophils Absolute: 0.4 10*3/uL (ref 0.0–0.7)
Eosinophils Relative: 3 % (ref 0–5)
Eosinophils Relative: 4 % (ref 0–5)
Lymphocytes Relative: 14 % (ref 12–46)
Lymphocytes Relative: 26 % (ref 12–46)
Lymphocytes Relative: 26 % (ref 12–46)
Lymphs Abs: 2 10*3/uL (ref 0.7–4.0)
Lymphs Abs: 2.6 10*3/uL (ref 0.7–4.0)
Monocytes Absolute: 0.7 10*3/uL (ref 0.1–1.0)
Monocytes Relative: 8 % (ref 3–12)
Neutro Abs: 11.3 10*3/uL — ABNORMAL HIGH (ref 1.7–7.7)
Neutro Abs: 6.4 10*3/uL (ref 1.7–7.7)
Neutrophils Relative %: 63 % (ref 43–77)
Neutrophils Relative %: 72 % (ref 43–77)
Neutrophils Relative %: 77 % (ref 43–77)

## 2010-08-03 LAB — PROTIME-INR
INR: 1.13 (ref 0.00–1.49)
Prothrombin Time: 14.4 seconds (ref 11.6–15.2)

## 2010-08-03 LAB — RETICULOCYTES
RBC.: 3.47 MIL/uL — ABNORMAL LOW (ref 3.87–5.11)
Retic Ct Pct: 1 % (ref 0.4–3.1)

## 2010-08-03 LAB — IRON AND TIBC
Saturation Ratios: 11 % — ABNORMAL LOW (ref 20–55)
UIBC: 250 ug/dL

## 2010-08-03 LAB — CROSSMATCH

## 2010-08-03 LAB — FOLATE: Folate: 2.6 ng/mL — ABNORMAL LOW

## 2010-08-03 LAB — VITAMIN B12: Vitamin B-12: 470 pg/mL (ref 211–911)

## 2010-08-03 LAB — WOUND CULTURE: Gram Stain: NONE SEEN

## 2010-08-04 LAB — CBC
HCT: 30.7 % — ABNORMAL LOW (ref 36.0–46.0)
Hemoglobin: 10.7 g/dL — ABNORMAL LOW (ref 12.0–15.0)
Hemoglobin: 9.4 g/dL — ABNORMAL LOW (ref 12.0–15.0)
MCHC: 33.2 g/dL (ref 30.0–36.0)
MCHC: 33.8 g/dL (ref 30.0–36.0)
MCHC: 34.7 g/dL (ref 30.0–36.0)
MCV: 83.2 fL (ref 78.0–100.0)
MCV: 81.5 fL (ref 78.0–100.0)
MCV: 82.5 fL (ref 78.0–100.0)
MCV: 83.4 fL (ref 78.0–100.0)
Platelets: 303 10*3/uL (ref 150–400)
Platelets: 309 10*3/uL (ref 150–400)
RBC: 3.38 MIL/uL — ABNORMAL LOW (ref 3.87–5.11)
RBC: 3.77 MIL/uL — ABNORMAL LOW (ref 3.87–5.11)
RBC: 3.78 MIL/uL — ABNORMAL LOW (ref 3.87–5.11)
WBC: 10 10*3/uL (ref 4.0–10.5)
WBC: 10.3 10*3/uL (ref 4.0–10.5)
WBC: 9.1 10*3/uL (ref 4.0–10.5)

## 2010-08-04 LAB — GLUCOSE, CAPILLARY
Glucose-Capillary: 213 mg/dL — ABNORMAL HIGH (ref 70–99)
Glucose-Capillary: 235 mg/dL — ABNORMAL HIGH (ref 70–99)
Glucose-Capillary: 236 mg/dL — ABNORMAL HIGH (ref 70–99)
Glucose-Capillary: 249 mg/dL — ABNORMAL HIGH (ref 70–99)
Glucose-Capillary: 253 mg/dL — ABNORMAL HIGH (ref 70–99)
Glucose-Capillary: 261 mg/dL — ABNORMAL HIGH (ref 70–99)
Glucose-Capillary: 290 mg/dL — ABNORMAL HIGH (ref 70–99)
Glucose-Capillary: 213 mg/dL — ABNORMAL HIGH (ref 70–99)
Glucose-Capillary: 232 mg/dL — ABNORMAL HIGH (ref 70–99)
Glucose-Capillary: 249 mg/dL — ABNORMAL HIGH (ref 70–99)
Glucose-Capillary: 272 mg/dL — ABNORMAL HIGH (ref 70–99)
Glucose-Capillary: 308 mg/dL — ABNORMAL HIGH (ref 70–99)
Glucose-Capillary: 357 mg/dL — ABNORMAL HIGH (ref 70–99)

## 2010-08-04 LAB — MAGNESIUM: Magnesium: 2.1 mg/dL (ref 1.5–2.5)

## 2010-08-04 LAB — HIV ANTIBODY (ROUTINE TESTING W REFLEX): HIV: NONREACTIVE

## 2010-08-04 LAB — RETICULOCYTES
RBC.: 3.51 MIL/uL — ABNORMAL LOW (ref 3.87–5.11)
Retic Count, Absolute: 70.2 10*3/uL (ref 19.0–186.0)
Retic Ct Pct: 2 % (ref 0.4–3.1)
Retic Ct Pct: 2.3 % (ref 0.4–3.1)

## 2010-08-04 LAB — RAPID URINE DRUG SCREEN, HOSP PERFORMED
Amphetamines: NOT DETECTED
Benzodiazepines: NOT DETECTED
Tetrahydrocannabinol: NOT DETECTED

## 2010-08-04 LAB — T4, FREE: Free T4: 0.4 ng/dL — ABNORMAL LOW (ref 0.80–1.80)

## 2010-08-04 LAB — URINALYSIS, ROUTINE W REFLEX MICROSCOPIC
Ketones, ur: NEGATIVE mg/dL
Leukocytes, UA: NEGATIVE
Nitrite: NEGATIVE
Protein, ur: >300 mg/dL — AB
Urobilinogen, UA: 0.2 mg/dL (ref 0.0–1.0)

## 2010-08-04 LAB — LIPID PANEL
Total CHOL/HDL Ratio: 5.6 RATIO
VLDL: 28 mg/dL (ref 0–40)

## 2010-08-04 LAB — HEMOGLOBIN A1C
Hgb A1c MFr Bld: 11.8 % — ABNORMAL HIGH (ref 4.6–6.1)
Mean Plasma Glucose: 292 mg/dL

## 2010-08-04 LAB — FERRITIN: Ferritin: 9 ng/mL — ABNORMAL LOW (ref 10–291)

## 2010-08-04 LAB — COMPREHENSIVE METABOLIC PANEL
ALT: 13 U/L (ref 0–35)
ALT: 14 U/L (ref 0–35)
ALT: 16 U/L (ref 0–35)
AST: 12 U/L (ref 0–37)
AST: 13 U/L (ref 0–37)
Albumin: 2.3 g/dL — ABNORMAL LOW (ref 3.5–5.2)
Albumin: 2.2 g/dL — ABNORMAL LOW (ref 3.5–5.2)
Alkaline Phosphatase: 76 U/L (ref 39–117)
CO2: 23 mEq/L (ref 19–32)
CO2: 24 mEq/L (ref 19–32)
CO2: 24 mEq/L (ref 19–32)
Calcium: 8.3 mg/dL — ABNORMAL LOW (ref 8.4–10.5)
Calcium: 8.4 mg/dL (ref 8.4–10.5)
Calcium: 8.6 mg/dL (ref 8.4–10.5)
Chloride: 107 mEq/L (ref 96–112)
Chloride: 106 mEq/L (ref 96–112)
Chloride: 107 mEq/L (ref 96–112)
Creatinine, Ser: 2.2 mg/dL — ABNORMAL HIGH (ref 0.4–1.2)
Creatinine, Ser: 1.83 mg/dL — ABNORMAL HIGH (ref 0.4–1.2)
Creatinine, Ser: 1.9 mg/dL — ABNORMAL HIGH (ref 0.4–1.2)
GFR calc Af Amer: 30 mL/min — ABNORMAL LOW (ref >60)
GFR calc Af Amer: 37 mL/min — ABNORMAL LOW (ref >60)
GFR calc non Af Amer: 30 mL/min — ABNORMAL LOW (ref >60)
GFR calc non Af Amer: 30 mL/min — ABNORMAL LOW (ref >60)
Glucose, Bld: 252 mg/dL — ABNORMAL HIGH (ref 70–99)
Glucose, Bld: 400 mg/dL — ABNORMAL HIGH (ref 70–99)
Sodium: 133 mEq/L — ABNORMAL LOW (ref 135–145)
Sodium: 135 mEq/L (ref 135–145)
Sodium: 136 mEq/L (ref 135–145)
Total Bilirubin: 0.3 mg/dL (ref 0.3–1.2)
Total Bilirubin: 0.3 mg/dL (ref 0.3–1.2)

## 2010-08-04 LAB — BASIC METABOLIC PANEL
CO2: 24 mEq/L (ref 19–32)
Calcium: 8 mg/dL — ABNORMAL LOW (ref 8.4–10.5)
Calcium: 8.1 mg/dL — ABNORMAL LOW (ref 8.4–10.5)
GFR calc Af Amer: 31 mL/min — ABNORMAL LOW (ref >60)
GFR calc Af Amer: 31 mL/min — ABNORMAL LOW (ref >60)
GFR calc non Af Amer: 25 mL/min — ABNORMAL LOW (ref >60)
GFR calc non Af Amer: 26 mL/min — ABNORMAL LOW (ref >60)
Potassium: 4.3 mEq/L (ref 3.5–5.1)
Sodium: 133 mEq/L — ABNORMAL LOW (ref 135–145)
Sodium: 135 mEq/L (ref 135–145)

## 2010-08-04 LAB — CARDIAC PANEL(CRET KIN+CKTOT+MB+TROPI)
CK, MB: 5.3 ng/mL — ABNORMAL HIGH (ref 0.3–4.0)
Total CK: 213 U/L — ABNORMAL HIGH (ref 7–177)
Total CK: 228 U/L — ABNORMAL HIGH (ref 7–177)
Troponin I: 0.11 ng/mL — ABNORMAL HIGH (ref 0.00–0.06)
Troponin I: 0.17 ng/mL — ABNORMAL HIGH (ref 0.00–0.06)

## 2010-08-04 LAB — IRON AND TIBC
Iron: 24 ug/dL — ABNORMAL LOW (ref 42–135)
UIBC: 232 ug/dL

## 2010-08-04 LAB — DIFFERENTIAL
Basophils Absolute: 0.3 10*3/uL — ABNORMAL HIGH (ref 0.0–0.1)
Basophils Absolute: 0.1 10*3/uL (ref 0.0–0.1)
Basophils Relative: 1 % (ref 0–1)
Basophils Relative: 1 % (ref 0–1)
Eosinophils Absolute: 0.2 10*3/uL (ref 0.0–0.7)
Eosinophils Absolute: 0.2 10*3/uL (ref 0.0–0.7)
Eosinophils Relative: 3 % (ref 0–5)
Lymphocytes Relative: 27 % (ref 12–46)
Lymphs Abs: 2.7 10*3/uL (ref 0.7–4.0)
Lymphs Abs: 1.8 10*3/uL (ref 0.7–4.0)
Lymphs Abs: 2.2 10*3/uL (ref 0.7–4.0)
Monocytes Absolute: 0.5 10*3/uL (ref 0.1–1.0)
Neutrophils Relative %: 67 % (ref 43–77)
Neutrophils Relative %: 78 % — ABNORMAL HIGH (ref 43–77)

## 2010-08-04 LAB — URINE MICROSCOPIC-ADD ON

## 2010-08-04 LAB — BRAIN NATRIURETIC PEPTIDE
Pro B Natriuretic peptide (BNP): 242 pg/mL — ABNORMAL HIGH (ref 0.0–100.0)
Pro B Natriuretic peptide (BNP): 250 pg/mL — ABNORMAL HIGH (ref 0.0–100.0)

## 2010-08-04 LAB — D-DIMER, QUANTITATIVE: D-Dimer, Quant: 0.87 ug/mL-FEU — ABNORMAL HIGH (ref 0.00–0.48)

## 2010-08-04 LAB — ANA: Anti Nuclear Antibody(ANA): NEGATIVE

## 2010-08-04 LAB — LIPASE, BLOOD: Lipase: 47 U/L (ref 11–59)

## 2010-08-04 LAB — OSMOLALITY: Osmolality: 304 mOsm/kg — ABNORMAL HIGH (ref 275–300)

## 2010-08-04 LAB — FOLATE RBC: RBC Folate: 732 ng/mL — ABNORMAL HIGH (ref 180–600)

## 2010-08-04 LAB — POCT CARDIAC MARKERS

## 2010-08-07 LAB — GLUCOSE, CAPILLARY

## 2010-08-07 LAB — URINE MICROSCOPIC-ADD ON

## 2010-08-07 LAB — URINALYSIS, ROUTINE W REFLEX MICROSCOPIC
Bilirubin Urine: NEGATIVE
Glucose, UA: 500 mg/dL — AB
Nitrite: NEGATIVE
Specific Gravity, Urine: 1.025 (ref 1.005–1.030)
pH: 6 (ref 5.0–8.0)

## 2010-08-07 LAB — GC/CHLAMYDIA PROBE AMP, GENITAL: GC Probe Amp, Genital: NEGATIVE

## 2010-08-07 LAB — WET PREP, GENITAL
Clue Cells Wet Prep HPF POC: NONE SEEN
Yeast Wet Prep HPF POC: NONE SEEN

## 2010-08-08 LAB — POCT I-STAT, CHEM 8
BUN: 57 mg/dL — ABNORMAL HIGH (ref 6–23)
Chloride: 105 mEq/L (ref 96–112)
Creatinine, Ser: 2.5 mg/dL — ABNORMAL HIGH (ref 0.4–1.2)
Glucose, Bld: 319 mg/dL — ABNORMAL HIGH (ref 70–99)
Hemoglobin: 10.9 g/dL — ABNORMAL LOW (ref 12.0–15.0)
Potassium: 4.1 mEq/L (ref 3.5–5.1)

## 2010-08-08 LAB — GLUCOSE, CAPILLARY: Glucose-Capillary: 327 mg/dL — ABNORMAL HIGH (ref 70–99)

## 2018-04-04 ENCOUNTER — Emergency Department (HOSPITAL_COMMUNITY)
Admission: EM | Admit: 2018-04-04 | Discharge: 2018-04-05 | Disposition: A | Discharge status: Home / Self Care | Payer: Medicare Other | Attending: Emergency Medicine | Admitting: Emergency Medicine

## 2018-04-04 DIAGNOSIS — E119 Type 2 diabetes mellitus without complications: Secondary | ICD-10-CM | POA: Diagnosis not present

## 2018-04-04 DIAGNOSIS — I110 Hypertensive heart disease with heart failure: Secondary | ICD-10-CM | POA: Insufficient documentation

## 2018-04-04 DIAGNOSIS — I5032 Chronic diastolic (congestive) heart failure: Secondary | ICD-10-CM | POA: Diagnosis not present

## 2018-04-04 DIAGNOSIS — R1011 Right upper quadrant pain: Secondary | ICD-10-CM | POA: Insufficient documentation

## 2018-04-04 DIAGNOSIS — K5909 Other constipation: Secondary | ICD-10-CM | POA: Diagnosis not present

## 2018-04-04 HISTORY — DX: Unspecified hearing loss, unspecified ear: H91.90

## 2018-04-05 ENCOUNTER — Other Ambulatory Visit: Payer: Self-pay

## 2018-04-05 ENCOUNTER — Emergency Department (HOSPITAL_COMMUNITY): Payer: Medicare Other

## 2018-04-05 ENCOUNTER — Encounter (HOSPITAL_COMMUNITY): Payer: Self-pay

## 2018-04-05 DIAGNOSIS — R1011 Right upper quadrant pain: Secondary | ICD-10-CM | POA: Diagnosis not present

## 2018-04-05 DIAGNOSIS — H9190 Unspecified hearing loss, unspecified ear: Secondary | ICD-10-CM

## 2018-04-05 HISTORY — DX: Unspecified hearing loss, unspecified ear: H91.90

## 2018-04-05 LAB — COMPREHENSIVE METABOLIC PANEL
ALK PHOS: 42 U/L (ref 38–126)
ALT: 16 U/L (ref 0–44)
ANION GAP: 15 (ref 5–15)
AST: 14 U/L — ABNORMAL LOW (ref 15–41)
Albumin: 3.7 g/dL (ref 3.5–5.0)
BUN: 61 mg/dL — ABNORMAL HIGH (ref 6–20)
CALCIUM: 8.5 mg/dL — AB (ref 8.9–10.3)
CO2: 25 mmol/L (ref 22–32)
Chloride: 100 mmol/L (ref 98–111)
Creatinine, Ser: 7.63 mg/dL — ABNORMAL HIGH (ref 0.44–1.00)
GFR, EST AFRICAN AMERICAN: 7 mL/min — AB (ref >60)
GFR, EST NON AFRICAN AMERICAN: 6 mL/min — AB (ref >60)
Glucose, Bld: 166 mg/dL — ABNORMAL HIGH (ref 70–99)
Potassium: 4 mmol/L (ref 3.5–5.1)
Sodium: 140 mmol/L (ref 135–145)
Total Bilirubin: 0.4 mg/dL (ref 0.3–1.2)
Total Protein: 7.2 g/dL (ref 6.5–8.1)

## 2018-04-05 LAB — LIPASE, BLOOD: LIPASE: 56 U/L — AB (ref 11–51)

## 2018-04-05 LAB — CBC
HCT: 31.4 % — ABNORMAL LOW (ref 36.0–46.0)
Hemoglobin: 9.2 g/dL — ABNORMAL LOW (ref 12.0–15.0)
MCH: 26.9 pg (ref 26.0–34.0)
MCHC: 29.3 g/dL — ABNORMAL LOW (ref 30.0–36.0)
MCV: 91.8 fL (ref 80.0–100.0)
NRBC: 0 % (ref 0.0–0.2)
PLATELETS: 186 10*3/uL (ref 150–400)
RBC: 3.42 MIL/uL — AB (ref 3.87–5.11)
RDW: 18.4 % — AB (ref 11.5–15.5)
WBC: 7.9 10*3/uL (ref 4.0–10.5)

## 2018-04-05 LAB — I-STAT BETA HCG BLOOD, ED (MC, WL, AP ONLY): I-stat hCG, quantitative: 7.1 m[IU]/mL — ABNORMAL HIGH (ref <5)

## 2018-04-05 MED ORDER — POLYETHYLENE GLYCOL 3350 17 G PO PACK: 17.0000 g | PACK | Freq: Every day | ORAL | 0 refills | Status: DC

## 2018-04-05 MED ORDER — HYDROCODONE-ACETAMINOPHEN 5-325 MG PO TABS
1.0000 | ORAL_TABLET | Freq: Once | ORAL | Status: DC
  Filled 2018-04-05: qty 1

## 2018-04-05 NOTE — ED Notes (Signed)
Patient verbalizes understanding of discharge instructions. Opportunity for questioning and answers were provided. Armband removed by staff, pt discharged from ED via wheelchair to wait for cab.

## 2018-04-05 NOTE — ED Triage Notes (Signed)
Pt is deaf, with the use of an interpreter patient complains of generalized abdominal pain and unable to have a bowel movement in 2 days.  No nausea or vomiting, just bloating.

## 2018-04-05 NOTE — ED Notes (Signed)
Patient transported to X-ray 

## 2018-04-05 NOTE — ED Provider Notes (Signed)
[facility] EMERGENCY DEPARTMENT Provider Note   CSN: [id number] Arrival date & time: 04/04/18  2337     History   Chief Complaint Chief Complaint  Patient presents with  . Abdominal Pain  . Constipation    HPI Sandra Roth is a 47 y.o. female.  The history is provided by the patient. A language interpreter was used (ASL [id number]).  Abdominal Pain   This is a new problem. The current episode started 2 days ago. The problem occurs constantly. The problem has been gradually worsening. The pain is located in the epigastric region. Associated symptoms include nausea and constipation. Pertinent negatives include fever, diarrhea and vomiting. The symptoms are aggravated by certain positions (walking). Nothing relieves the symptoms.  Constipation   Associated symptoms include abdominal pain.  pt reports recent abd pain and constipation She also reports increasing SOB pt had dialysis today Patient reports minimal urine output at baseline Reports this pain is similar to her recent evaluation at [facility]  Past Medical History:  Diagnosis Date  . Deaf 04/05/2018    Patient Active Problem List   Diagnosis Date Noted  . 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 HYPERTENSION, BENIGN 02/22/2009  . PERICARDIAL EFFUSION 02/22/2009  . Chronic diastolic heart failure (HCC) 02/22/2009  . CONSTIPATION 02/22/2009      OB History   None      Home Medications    Prior to Admission medications   Not on File    Family History No family history on file.  Social History Social History   Tobacco Use  . Smoking status: Never Smoker  . Smokeless tobacco: Never Used  Substance Use Topics  . Alcohol use: Never    Frequency: Never  . Drug use: Never     Allergies   Patient has no known allergies.   Review of Systems Review of Systems  Constitutional: Negative for fever.  Respiratory: Positive for shortness of breath.     Cardiovascular: Negative for chest pain.  Gastrointestinal: Positive for abdominal pain, constipation and nausea. Negative for diarrhea and vomiting.  All other systems reviewed and are negative.    Physical Exam Updated Vital Signs BP 132/68 (BP Location: Right Arm)   Pulse 73   Temp 98.4 F (36.9 C) (Oral)   Resp 16   LMP  (LMP Unknown)   SpO2 96%   Physical Exam CONSTITUTIONAL: Chronically ill-appearing HEAD: Normocephalic/atraumatic EYES: EOMI/PERRL mild icterus ENMT: Mucous membranes moist NECK: supple no meningeal signs SPINE/BACK:entire spine nontender CV: S1/S2 noted, no murmurs/rubs/gallops noted LUNGS: Lungs are clear to auscultation bilaterally, no apparent distress ABDOMEN: soft, nontender, no rebound or guarding, bowel sounds noted throughout abdomen Rectal-no stool impaction, no rectal masses, no blood, no melena, nurse present for exam GU:no cva tenderness NEURO: Pt is awake/alert/appropriate, moves all extremitiesx4.  No facial droop.  Is essentially nonverbal at baseline EXTREMITIES: pulses normal/equal, full ROM, dialysis access to left arm with thrill noted SKIN: warm, color normal   ED Treatments / Results  Labs (all labs ordered are listed, but only abnormal results are displayed) Labs Reviewed  LIPASE, BLOOD - Abnormal; Notable for the following components:      Result Value   Lipase 56 (*)    All other components within normal limits  COMPREHENSIVE METABOLIC PANEL - Abnormal; Notable for the following components:   Glucose, Bld 166 (*)    BUN 61 (*)    Creatinine, Ser 7.63 (*)    Calcium  8.5 (*)    AST 14 (*)    GFR calc non Af Amer 6 (*)    GFR calc Af Amer 7 (*)    All other components within normal limits  CBC - Abnormal; Notable for the following components:   RBC 3.42 (*)    Hemoglobin 9.2 (*)    HCT 31.4 (*)    MCHC 29.3 (*)    RDW 18.4 (*)    All other components within normal limits  I-STAT BETA HCG BLOOD, ED ([facility], [facility], [facility] ONLY)  - Abnormal; Notable for the following components:   I-stat hCG, quantitative 7.1 (*)    All other components within normal limits    EKG EKG Interpretation  Date/Time:  Friday April 05 2018 01:30:48 EST Ventricular Rate:  69 PR Interval:    QRS Duration: 126 QT Interval:  469 QTC Calculation: 503 R Axis:   -63 Text Interpretation:  Sinus rhythm Probable left atrial enlargement Left bundle branch block Interpretation limited secondary to artifact Confirmed by Zadie RhineWickline, Lakyn Alsteen ([id number]) on 04/05/2018 1:36:44 AM   Radiology Dg Abdomen Acute W/chest  Result Date: 04/05/2018 CLINICAL DATA:  Generalized abdominal pain EXAM: DG ABDOMEN ACUTE W/ 1V CHEST COMPARISON:  02/11/2009 FINDINGS: Cardiomegaly with vascular congestion. No overt edema. No confluent opacities or effusions. Nonobstructive bowel gas pattern. Large stool burden throughout the colon. IVC filter noted in place. No organomegaly or free air. IMPRESSION: Large stool burden in the colon.  No obstruction or free air. Cardiomegaly, vascular congestion Electronically Signed   By: Charlett NoseKevin  Dover M.D.   On: 04/05/2018 02:21    Procedures Procedures  Medications Ordered in ED Medications - No data to display   Initial Impression / Assessment and Plan / ED Course  I have reviewed the triage vital signs and the nursing notes.  Pertinent labs & imaging results that were available during my care of the patient were reviewed by me and considered in my medical decision making (see chart for details).     1:46 AM [facility] pain, CT imaging not reveal any acute abdominal process, but did have recurrent pericardial effusion which she had had before At this time her abdominal exam is unremarkable.  She does mention shortness of breath.  X-rays are pending at this time 3:57 AM Acute abdominal series reveals constipation but no other acute findings.  Appears comfortable but still  reports she has pain.  When using interpreter, she reports she has this pain just about every day usually with eating.  It starts in her right upper quadrant and epigastric region. No recent imaging of her gallbladder has been found, I will order ultrasound.  If negative I feel she would be safe for discharge home. This appears to be more of a chronic process 6:37 AM No convincing signs of acute cholecystitis. Patient has been stable.  Will discharge home, MiraLAX for constipation. Sign language interpreter used Final Clinical Impressions(s) / ED Diagnoses   Final diagnoses:  RUQ pain  Other constipation    ED Discharge Orders         Ordered    polyethylene glycol (MIRALAX / GLYCOLAX) packet  Daily     04/05/18 [id number]           Zadie RhineWickline, Sharilyn Geisinger, MD 04/05/18 [phone]

## 2018-04-05 NOTE — ED Notes (Signed)
ED Provider at bedside. 

## 2018-04-05 NOTE — ED Notes (Signed)
Pt provided with taxi voucher and escorted to lobby by tech, Diplomatic Services operational officersecretary to call cab for patient. Staff aware that patient is deaf. Pt nods her head in understanding after written communication.

## 2018-04-05 NOTE — ED Notes (Signed)
Patient transported to Ultrasound 

## 2018-04-05 NOTE — ED Notes (Signed)
This RN and DR Bebe ShaggyWickline went over dc paperwork with patient utilizing sign language interpretor. Pt verbalized understanding of dc paperwork. Per patient's request, this RN called patient's friend for a ride but was unable to get an answer at either number provided. HIPPA compliant message left on machine for patient's friend to contact us here in the ED.

## 2018-04-14 ENCOUNTER — Emergency Department (HOSPITAL_COMMUNITY)
Admission: EM | Admit: 2018-04-14 | Discharge: 2018-04-15 | Disposition: A | Discharge status: Home / Self Care | Payer: Medicare Other | Attending: Emergency Medicine | Admitting: Emergency Medicine

## 2018-04-14 ENCOUNTER — Emergency Department (HOSPITAL_COMMUNITY): Payer: Medicare Other

## 2018-04-14 ENCOUNTER — Encounter (HOSPITAL_COMMUNITY): Payer: Self-pay | Admitting: Emergency Medicine

## 2018-04-14 DIAGNOSIS — R140 Abdominal distension (gaseous): Secondary | ICD-10-CM | POA: Insufficient documentation

## 2018-04-14 DIAGNOSIS — Z992 Dependence on renal dialysis: Secondary | ICD-10-CM | POA: Diagnosis not present

## 2018-04-14 DIAGNOSIS — F32A Depression, unspecified: Secondary | ICD-10-CM

## 2018-04-14 DIAGNOSIS — E1122 Type 2 diabetes mellitus with diabetic chronic kidney disease: Secondary | ICD-10-CM | POA: Diagnosis not present

## 2018-04-14 DIAGNOSIS — F4325 Adjustment disorder with mixed disturbance of emotions and conduct: Secondary | ICD-10-CM | POA: Diagnosis present

## 2018-04-14 DIAGNOSIS — H9190 Unspecified hearing loss, unspecified ear: Secondary | ICD-10-CM | POA: Insufficient documentation

## 2018-04-14 DIAGNOSIS — I110 Hypertensive heart disease with heart failure: Secondary | ICD-10-CM | POA: Insufficient documentation

## 2018-04-14 DIAGNOSIS — I5022 Chronic systolic (congestive) heart failure: Secondary | ICD-10-CM | POA: Diagnosis not present

## 2018-04-14 DIAGNOSIS — N186 End stage renal disease: Secondary | ICD-10-CM | POA: Insufficient documentation

## 2018-04-14 DIAGNOSIS — R1084 Generalized abdominal pain: Secondary | ICD-10-CM | POA: Insufficient documentation

## 2018-04-14 DIAGNOSIS — Z590 Homelessness: Secondary | ICD-10-CM | POA: Diagnosis not present

## 2018-04-14 DIAGNOSIS — N189 Chronic kidney disease, unspecified: Secondary | ICD-10-CM

## 2018-04-14 DIAGNOSIS — I132 Hypertensive heart and chronic kidney disease with heart failure and with stage 5 chronic kidney disease, or end stage renal disease: Secondary | ICD-10-CM | POA: Diagnosis not present

## 2018-04-14 DIAGNOSIS — F319 Bipolar disorder, unspecified: Secondary | ICD-10-CM | POA: Diagnosis present

## 2018-04-14 DIAGNOSIS — N179 Acute kidney failure, unspecified: Secondary | ICD-10-CM | POA: Diagnosis not present

## 2018-04-14 DIAGNOSIS — F329 Major depressive disorder, single episode, unspecified: Secondary | ICD-10-CM

## 2018-04-14 HISTORY — DX: Type 2 diabetes mellitus without complications: E11.9

## 2018-04-14 HISTORY — DX: Disorder of kidney and ureter, unspecified: N28.9

## 2018-04-14 LAB — COMPREHENSIVE METABOLIC PANEL
ALT: 14 U/L (ref 0–44)
AST: 13 U/L — AB (ref 15–41)
Albumin: 4 g/dL (ref 3.5–5.0)
Alkaline Phosphatase: 40 U/L (ref 38–126)
Anion gap: 16 — ABNORMAL HIGH (ref 5–15)
BUN: 45 mg/dL — ABNORMAL HIGH (ref 6–20)
CO2: 26 mmol/L (ref 22–32)
Calcium: 8.2 mg/dL — ABNORMAL LOW (ref 8.9–10.3)
Chloride: 96 mmol/L — ABNORMAL LOW (ref 98–111)
Creatinine, Ser: 6.72 mg/dL — ABNORMAL HIGH (ref 0.44–1.00)
GFR calc Af Amer: 8 mL/min — ABNORMAL LOW (ref >60)
GFR, EST NON AFRICAN AMERICAN: 7 mL/min — AB (ref >60)
Glucose, Bld: 133 mg/dL — ABNORMAL HIGH (ref 70–99)
Potassium: 4.4 mmol/L (ref 3.5–5.1)
Sodium: 138 mmol/L (ref 135–145)
Total Bilirubin: 0.6 mg/dL (ref 0.3–1.2)
Total Protein: 7.5 g/dL (ref 6.5–8.1)

## 2018-04-14 LAB — CBC WITH DIFFERENTIAL/PLATELET
Abs Immature Granulocytes: 0.02 10*3/uL (ref 0.00–0.07)
Basophils Absolute: 0 10*3/uL (ref 0.0–0.1)
Basophils Relative: 0 %
Eosinophils Absolute: 0.1 10*3/uL (ref 0.0–0.5)
Eosinophils Relative: 1 %
HCT: 30.7 % — ABNORMAL LOW (ref 36.0–46.0)
Hemoglobin: 9.2 g/dL — ABNORMAL LOW (ref 12.0–15.0)
Immature Granulocytes: 0 %
Lymphocytes Relative: 16 %
Lymphs Abs: 1.3 10*3/uL (ref 0.7–4.0)
MCH: 28.1 pg (ref 26.0–34.0)
MCHC: 30 g/dL (ref 30.0–36.0)
MCV: 93.9 fL (ref 80.0–100.0)
Monocytes Absolute: 0.4 10*3/uL (ref 0.1–1.0)
Monocytes Relative: 4 %
Neutro Abs: 6.3 10*3/uL (ref 1.7–7.7)
Neutrophils Relative %: 79 %
PLATELETS: 179 10*3/uL (ref 150–400)
RBC: 3.27 MIL/uL — AB (ref 3.87–5.11)
RDW: 17.7 % — ABNORMAL HIGH (ref 11.5–15.5)
WBC: 8.1 10*3/uL (ref 4.0–10.5)
nRBC: 0 % (ref 0.0–0.2)

## 2018-04-14 LAB — LIPASE, BLOOD: Lipase: 43 U/L (ref 11–51)

## 2018-04-14 LAB — ETHANOL

## 2018-04-14 NOTE — [facility] Assessment (Signed)
[facility] Assessment Progress Note  Per Nira ConnJason Berry, NP pt is recommended for continued observation for safety and stabilization and to be reassessed in the AM by psych. EDP Linwood DibblesKnapp, Jon, MD and pt's nurse Ronnell FreshwaterLondon, Latricia L, RN have been advised.  Princess BruinsAquicha Bradford Cazier, MSW, LCSW Therapeutic Triage Specialist  [phone]

## 2018-04-14 NOTE — ED Provider Notes (Signed)
[facility]-EMERGENCY DEPT Provider Note   CSN: [id number] Arrival date & time: 04/14/18  2133     History   Chief Complaint Chief Complaint  Patient presents with  . IVC/Deaf/Abd Swelling    HPI Sorayah Schrodt is a 47 y.o. female.  HPI Pt presents to the ED after being placed on IVC.  According to the paperwork, pt has bipolar.  She has not been taking her medications.  She has been aggressive with her roommate and has talked about killing herself.  Pt was kicked out of her apartment and the policed brought her to the ED.  Pt denies SI or HI.   She mentioned to the nurse having abdominal pain but she only told me that she has not used the bathroom today.  She is hungry and wants something to eat.  Past Medical History:  Diagnosis Date  . 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 Diabetes mellitus without complication (HCC)   . Renal disorder    Dialysis patient    Patient Active Problem List   Diagnosis Date Noted  . 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 HYPERTENSION, BENIGN 02/22/2009  . PERICARDIAL EFFUSION 02/22/2009  . Chronic diastolic heart failure (HCC) 02/22/2009  . CONSTIPATION 02/22/2009    History reviewed. No pertinent surgical history.   OB History   No obstetric history on file.      Home Medications    Prior to Admission medications   Medication Sig Start Date End Date Taking? Authorizing Provider  polyethylene glycol (MIRALAX / GLYCOLAX) packet Take 17 g by mouth daily. 04/05/18   Zadie Rhine, MD    Family History No family history on file.  Social History Social History   Tobacco Use  . Smoking status: Never Smoker  . Smokeless tobacco: Never Used  Substance Use Topics  . Alcohol use: Never    Frequency: Never  . Drug use: Never     Allergies   Patient has no known allergies.   Review of Systems Review of Systems  All other systems reviewed and are negative.    Physical Exam Updated Vital  Signs BP (!) 155/89 (BP Location: Right Arm)   Pulse 69   Temp 97.9 F (36.6 C)   Resp 20   LMP  (LMP Unknown) Comment: pt signed waiver  SpO2 92%   Physical Exam Vitals signs and nursing note reviewed.  Constitutional:      General: She is not in acute distress.    Appearance: She is well-developed.  HENT:     Head: Normocephalic and atraumatic.     Right Ear: External ear normal.     Left Ear: External ear normal.  Eyes:     General: No scleral icterus.       Right eye: No discharge.        Left eye: No discharge.     Conjunctiva/sclera: Conjunctivae normal.  Neck:     Musculoskeletal: Neck supple.     Trachea: No tracheal deviation.  Cardiovascular:     Rate and Rhythm: Normal rate and regular rhythm.  Pulmonary:     Effort: Pulmonary effort is normal. No respiratory distress.     Breath sounds: Normal breath sounds. No stridor. No wheezing or rales.  Abdominal:     General: Bowel sounds are normal. There is no distension.     Palpations: Abdomen is soft.     Tenderness: There is no abdominal tenderness. There is no guarding or rebound.  Musculoskeletal:  General: No tenderness.     Comments: S/p lle bka  Skin:    General: Skin is warm and dry.     Findings: No rash.  Neurological:     Mental Status: She is alert.     Cranial Nerves: No cranial nerve deficit (no facial droop, extraocular movements intact, no slurred speech).     Sensory: No sensory deficit.     Motor: No abnormal muscle tone or seizure activity.     Coordination: Coordination normal.      ED Treatments / Results  Labs (all labs ordered are listed, but only abnormal results are displayed) Labs Reviewed  COMPREHENSIVE METABOLIC PANEL - Abnormal; Notable for the following components:      Result Value   Chloride 96 (*)    Glucose, Bld 133 (*)    BUN 45 (*)    Creatinine, Ser 6.72 (*)    Calcium 8.2 (*)    AST 13 (*)    GFR calc non Af Amer 7 (*)    GFR calc Af Amer 8 (*)    Anion  gap 16 (*)    All other components within normal limits  CBC WITH DIFFERENTIAL/PLATELET - Abnormal; Notable for the following components:   RBC 3.27 (*)    Hemoglobin 9.2 (*)    HCT 30.7 (*)    RDW 17.7 (*)    All other components within normal limits  ETHANOL  LIPASE, BLOOD     Radiology Dg Chest 2 View  Result Date: 04/14/2018 CLINICAL DATA:  Dialysis patient EXAM: CHEST - 2 VIEW COMPARISON:  04/05/2018 FINDINGS: Cardiomegaly with vascular congestion and bilateral airspace opacities likely edema. No visible effusions or acute bony abnormality. IMPRESSION: Mild edema/CHF. Electronically Signed   By: Charlett NoseKevin  Dover M.D.   On: 04/14/2018 23:15    Procedures Procedures (including critical care time)  Medications Ordered in ED Medications - No data to display   Initial Impression / Assessment and Plan / ED Course  I have reviewed the triage vital signs and the nursing notes.  Pertinent labs & imaging results that were available during my care of the patient were reviewed by me and considered in my medical decision making (see chart for details).  Clinical Course as of Apr 14 2337  Wynelle LinkSun Apr 14, 2018  2336 Hemoglobin is stable.  Electrolyte panels consistent with her chronic renal failure   [JK]  2337 Pt was assessed by psychiatry.  Plan on overnight observation.  Reassess in the am   [JK]    Clinical Course User Index [JK] Linwood DibblesKnapp, Hagop Mccollam, MD   Patient presented to the emergency room for evaluation of depression.  Patient mentioned having abdominal pain to the nurse however on my exam her abdomen is soft and nontender.  Her labs are reassuring.  She is hungry and has an appetite.   Final Clinical Impressions(s) / ED Diagnoses   Final diagnoses:  Depression, unspecified depression type  Chronic renal failure, unspecified CKD stage      Linwood DibblesKnapp, Lanessa Shill, MD 04/14/18 2338

## 2018-04-14 NOTE — BH Assessment (Addendum)
Assessment Note  Sandra Roth is an 47 y.o. female who presents to the ED under IVC initiated by a friend. Sign language interpreter used via "Lauren [id number]". Per IVC, respondent "has been diagnosed with Bipolar disorder and doesn't take her medication. [place] [facility] [place] [facility]. Respondent is talking about killing herself and her roommates. Respondent is aggressive with her female roommate and puts hands on her often." Pt denies the information presented on the IVC and states she did not make threats to kill herself or anyone else. Pt states she got into an argument with her roommate and she kicked her out. Pt states she has nowhere to go. Pt states she has a hx of depression due to her parents being deceased. Pt states her mother passed away 2 years ago and her father died in 2007. Pt states she does not have any supports, guardians, or resources. Pt states she has lived with her roommate for 10 days and prior to that she lived in [place], [place]. It is unclear if the pt lived with roommates in [place] because she stated she lived in an apartment but she moved out in order to move to [place] with her roommate. Pt states she has 2 children that are grown but denies any caregivers or guardians.   Per Nira ConnJason Berry, NP pt is recommended for continued observation for safety and stabilization and to be reassessed in the AM by psych. EDP Linwood DibblesKnapp, Jon, MD and pt's nurse Ronnell FreshwaterLondon, Latricia L, RN have been advised.  Diagnosis: Unspecified depressive disorder  Past Medical History:  Past Medical History:  Diagnosis Date  . 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 Diabetes mellitus without complication (HCC)   . Renal disorder    Dialysis patient     Family History: No family history on file.  Social History:  reports that she has never smoked. She has never used smokeless tobacco. She reports that she does not drink alcohol or use drugs.  Additional Social History:  Alcohol / Drug  Use Pain Medications: See MAR Prescriptions: See MAR Over the Counter: See MAR History of alcohol / drug use?: No history of alcohol / drug abuse  CIWA: CIWA-Ar BP: (!) 155/89 Pulse Rate: 69 COWS:    Allergies: No Known Allergies  Home Medications: (Not in a hospital admission)   OB/GYN Status:  No LMP recorded (lmp unknown).  General Assessment Data [facility] of Assessment: [facility] TTS Assessment: In system Is this a Tele or Face-to-Face Assessment?: Face-to-Face Is this an Initial Assessment or a Re-assessment for this encounter?: Initial Assessment Patient Accompanied by:: N/A Language Other than English: Yes(sign language) What is your preferred language: Other (Comment: Enter the language)(sign language) What gender do you identify as?: Female Marital status: Single Pregnancy Status: No Living Arrangements: Non-relatives/Friends Can pt return to current living arrangement?: Yes Admission Status: Involuntary Petitioner: Other(friend) Is patient capable of signing voluntary admission?: Yes Referral Source: Self/Family/Friend Insurance type: [facility]     Crisis Care Plan Living Arrangements: Non-relatives/Friends Name of Psychiatrist: none Name of Therapist: none  Education Status Is patient currently in school?: No Is the patient employed, unemployed or receiving disability?: Receiving disability income  Risk to self with the past 6 months Suicidal Ideation: (pt denies, IVC reports pt made suicidal statements) Has patient been a risk to self within the past 6 months prior to admission? : No Suicidal Intent: No Has patient had any suicidal intent within the past 6 months prior to admission? : No Is patient at  risk for suicide?: No Suicidal Plan?: No Has patient had any suicidal plan within the past 6 months prior to admission? : No Access to Means: No What has been your use of drugs/alcohol within the last 12 months?: denies use Previous Attempts/Gestures:  No Triggers for Past Attempts: None known Intentional Self Injurious Behavior: None Family Suicide History: No Recent stressful life event(s): Other (Comment), Conflict (Comment)(argue with roommate) Persecutory voices/beliefs?: No Depression: Yes Depression Symptoms: Despondent, Feeling angry/irritable Substance abuse history and/or treatment for substance abuse?: No Suicide prevention information given to non-admitted patients: Not applicable  Risk to Others within the past 6 months Homicidal Ideation: (pt denies, IVC reports pt made HI threats) Does patient have any lifetime risk of violence toward others beyond the six months prior to admission? : No Thoughts of Harm to Others: No Current Homicidal Intent: No Current Homicidal Plan: No Access to Homicidal Means: No History of harm to others?: No Assessment of Violence: None Noted Does patient have access to weapons?: No Criminal Charges Pending?: No Does patient have a court date: No Is patient on probation?: No  Psychosis Hallucinations: None noted Delusions: None noted  Mental Status Report Appearance/Hygiene: In scrubs Eye Contact: Good Motor Activity: Freedom of movement Speech: Other (Comment)(sign language ) Level of Consciousness: Alert Mood: Depressed Affect: Flat Anxiety Level: None Thought Processes: Relevant, Coherent Judgement: Partial Orientation: Person, Place, Time Obsessive Compulsive Thoughts/Behaviors: None  Cognitive Functioning Concentration: Normal Memory: Remote Intact, Recent Intact Is patient IDD: No Insight: Fair Impulse Control: Fair Appetite: Good Have you had any weight changes? : No Change Sleep: No Change Total Hours of Sleep: 8 Vegetative Symptoms: None  ADLScreening [facility]) Patient's cognitive ability adequate to safely complete daily activities?: Yes Patient able to express need for assistance with ADLs?: Yes Independently performs ADLs?: No  Prior  Inpatient Therapy Prior Inpatient Therapy: No  Prior Outpatient Therapy Prior Outpatient Therapy: No Does patient have an ACCT team?: No Does patient have Intensive In-House Services?  : No Does patient have [facility] services? : No Does patient have P4CC services?: No  ADL Screening (condition at time of admission) Patient's cognitive ability adequate to safely complete daily activities?: Yes Is the patient deaf or have difficulty hearing?: Yes Does the patient have difficulty seeing, even when wearing glasses/contacts?: Yes Does the patient have difficulty concentrating, remembering, or making decisions?: No Patient able to express need for assistance with ADLs?: Yes Does the patient have difficulty dressing or bathing?: No Independently performs ADLs?: No Communication: Needs assistance Is this a change from baseline?: Pre-admission baseline Dressing (OT): Independent Grooming: Independent Feeding: Independent Bathing: Independent Toileting: Independent In/Out Bed: Independent Walks in Home: Needs assistance Is this a change from baseline?: Pre-admission baseline Does the patient have difficulty walking or climbing stairs?: Yes Weakness of Legs: Both Weakness of Arms/Hands: None  Home Assistive Devices/Equipment Home Assistive Devices/Equipment: Prosthesis, Cane (specify quad or straight)    Abuse/Neglect Assessment (Assessment to be complete while patient is alone) Abuse/Neglect Assessment Can Be Completed: Yes Physical Abuse: Denies Verbal Abuse: Denies Sexual Abuse: Denies Exploitation of patient/patient's resources: Denies Self-Neglect: Denies     Merchant navy officer (For Healthcare) Does Patient Have a Medical Advance Directive?: No Would patient like information on creating a medical advance directive?: No - Patient declined          Disposition: Per Nira Conn, NP pt is recommended for continued observation for safety and stabilization and to be  reassessed in the AM by psych. EDP Lynelle Doctor,  Cletis Athens, MD and pt's nurse Ronnell Freshwater, RN have been advised.  Disposition Initial Assessment Completed for this Encounter: Yes Disposition of Patient: (overnight OBS pending AM psych assessment) Patient refused recommended treatment: No  On Site Evaluation by:   Reviewed with Physician:    Karolee Ohs 04/14/2018 11:23 PM

## 2018-04-14 NOTE — ED Notes (Signed)
[facility] [place] [facility], pt under IVC, presents with SI after getting kicked out of apartment by room mate due to aggressive behavior.  Denies visual hallucinations.  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  Dr Roselyn BeringJ Knapp at bedside to eval pt.  Pt x-ray via wheelchair.  Pt wanded by [employer].  Pt is BKA Left leg.  Prostesis noted.  Shunt noted to Left forearm.  Pt reports diagnosed with Depression in the past.  A&O x 3, no acute distress noted, calm & cooperative.  Monitoring for safety, sitter at bedside.

## 2018-04-14 NOTE — ED Notes (Signed)
Her sign language interpreter Modesta MessingBrylan Buller [phone].

## 2018-04-14 NOTE — ED Triage Notes (Signed)
Deaf patient under IVC, [facility], presents with SI and HI of room mate.  Pt kicked out of apartment by room mate due to aggressive behavior.  Pt is A Diaylsis patient also, dialyzed Saturday.

## 2018-04-15 ENCOUNTER — Encounter (HOSPITAL_COMMUNITY): Payer: Self-pay

## 2018-04-15 ENCOUNTER — Other Ambulatory Visit: Payer: Self-pay

## 2018-04-15 ENCOUNTER — Emergency Department (HOSPITAL_COMMUNITY)
Admission: EM | Admit: 2018-04-15 | Discharge: 2018-04-16 | Disposition: A | Discharge status: Home / Self Care | Payer: Medicare Other | Source: Home / Self Care | Attending: Emergency Medicine | Admitting: Emergency Medicine

## 2018-04-15 DIAGNOSIS — H9190 Unspecified hearing loss, unspecified ear: Secondary | ICD-10-CM | POA: Insufficient documentation

## 2018-04-15 DIAGNOSIS — N186 End stage renal disease: Secondary | ICD-10-CM

## 2018-04-15 DIAGNOSIS — I132 Hypertensive heart and chronic kidney disease with heart failure and with stage 5 chronic kidney disease, or end stage renal disease: Secondary | ICD-10-CM

## 2018-04-15 DIAGNOSIS — E1122 Type 2 diabetes mellitus with diabetic chronic kidney disease: Secondary | ICD-10-CM | POA: Insufficient documentation

## 2018-04-15 DIAGNOSIS — Z992 Dependence on renal dialysis: Secondary | ICD-10-CM

## 2018-04-15 DIAGNOSIS — I110 Hypertensive heart disease with heart failure: Secondary | ICD-10-CM

## 2018-04-15 DIAGNOSIS — R1084 Generalized abdominal pain: Secondary | ICD-10-CM

## 2018-04-15 DIAGNOSIS — Z5900 Homelessness unspecified: Secondary | ICD-10-CM

## 2018-04-15 DIAGNOSIS — R140 Abdominal distension (gaseous): Secondary | ICD-10-CM

## 2018-04-15 DIAGNOSIS — F4325 Adjustment disorder with mixed disturbance of emotions and conduct: Secondary | ICD-10-CM | POA: Diagnosis present

## 2018-04-15 DIAGNOSIS — I5022 Chronic systolic (congestive) heart failure: Secondary | ICD-10-CM

## 2018-04-15 DIAGNOSIS — F329 Major depressive disorder, single episode, unspecified: Secondary | ICD-10-CM | POA: Diagnosis not present

## 2018-04-15 LAB — CBC
HEMATOCRIT: 32.7 % — AB (ref 36.0–46.0)
Hemoglobin: 9.6 g/dL — ABNORMAL LOW (ref 12.0–15.0)
MCH: 27 pg (ref 26.0–34.0)
MCHC: 29.4 g/dL — ABNORMAL LOW (ref 30.0–36.0)
MCV: 91.9 fL (ref 80.0–100.0)
Platelets: 201 10*3/uL (ref 150–400)
RBC: 3.56 MIL/uL — ABNORMAL LOW (ref 3.87–5.11)
RDW: 17.6 % — ABNORMAL HIGH (ref 11.5–15.5)
WBC: 7.8 10*3/uL (ref 4.0–10.5)
nRBC: 0 % (ref 0.0–0.2)

## 2018-04-15 LAB — I-STAT BETA HCG BLOOD, ED (MC, WL, AP ONLY): I-stat hCG, quantitative: 11 m[IU]/mL — ABNORMAL HIGH (ref <5)

## 2018-04-15 LAB — COMPREHENSIVE METABOLIC PANEL
ALT: 14 U/L (ref 0–44)
AST: 16 U/L (ref 15–41)
Albumin: 3.8 g/dL (ref 3.5–5.0)
Alkaline Phosphatase: 46 U/L (ref 38–126)
Anion gap: 16 — ABNORMAL HIGH (ref 5–15)
BUN: 61 mg/dL — ABNORMAL HIGH (ref 6–20)
CHLORIDE: 95 mmol/L — AB (ref 98–111)
CO2: 28 mmol/L (ref 22–32)
Calcium: 8.2 mg/dL — ABNORMAL LOW (ref 8.9–10.3)
Creatinine, Ser: 8.78 mg/dL — ABNORMAL HIGH (ref 0.44–1.00)
GFR calc Af Amer: 6 mL/min — ABNORMAL LOW (ref >60)
GFR calc non Af Amer: 5 mL/min — ABNORMAL LOW (ref >60)
Glucose, Bld: 120 mg/dL — ABNORMAL HIGH (ref 70–99)
Potassium: 5.2 mmol/L — ABNORMAL HIGH (ref 3.5–5.1)
Sodium: 139 mmol/L (ref 135–145)
Total Bilirubin: 0.7 mg/dL (ref 0.3–1.2)
Total Protein: 8.2 g/dL — ABNORMAL HIGH (ref 6.5–8.1)

## 2018-04-15 LAB — CBG MONITORING, ED: Glucose-Capillary: 118 mg/dL — ABNORMAL HIGH (ref 70–99)

## 2018-04-15 LAB — LIPASE, BLOOD: Lipase: 48 U/L (ref 11–51)

## 2018-04-15 MED ORDER — POLYETHYLENE GLYCOL 3350 17 G PO PACK
17.0000 g | PACK | Freq: Every day | ORAL | Status: DC
  Filled 2018-04-15: qty 1

## 2018-04-15 MED ORDER — ACETAMINOPHEN 325 MG PO TABS
650.0000 mg | ORAL_TABLET | Freq: Once | ORAL | Status: DC
  Filled 2018-04-15: qty 2

## 2018-04-15 NOTE — BH Assessment (Signed)
[facility] Progress Note  Per Juanetta BeetsJacqueline Norman, DO, this pt does not require psychiatric hospitalization at this time.  Pt presents under IVC initiated by a friend, which Dr Sharma CovertNorman has rescinded.  [facility].  This has been included in pt's discharge instructions.  This pt is hearing impaired; because she is new to the [place] area, she would also benefit from seeing a Child psychotherapistsocial worker.  At 10:40 I spoke to BendonKierra, the CSW currently covering [facility].  She reports that she is not working at this campus today, but will ask Christiane HaJonathan to see the pt when his shift starts this afternoon.  Pt's nurse, Clydie BraunKaren, has been notified.  Doylene Canninghomas Asante Blanda, MA Triage Specialist [phone]

## 2018-04-15 NOTE — ED Notes (Signed)
[facility] CPS [phone]) spoke with Clydie BraunKaren, ED secretary, to tell her that they did not feel it was appropriate for staff to contact minor child Sandra Roth regarding his mother. They do not wish for staff to attempt contact with him again.

## 2018-04-15 NOTE — ED Notes (Signed)
Report given to RN Kenisha Herbin.

## 2018-04-15 NOTE — Progress Notes (Signed)
47 yo female who presented to the ED after an argument escalated with her roommate to aggression.  When her roommate kicked her out, she was upset and stated suicidal ideations.  On assessment, she is calm and cooperative with no suicidal/homicidal ideations, hallucinations, and substance abuse.  Stable for discharge, social work consult placed as she is upset about not having a place to live and her stuff and money are at her apartment where she got kicked out.  Nanine MeansJamison Lord, PMHNP

## 2018-04-15 NOTE — BHH Suicide Risk Assessment (Signed)
Suicide Risk Assessment  Discharge Assessment   [facility]   Principal Problem: Adjustment disorder with mixed disturbance of emotions and conduct Discharge Diagnoses: Principal Problem:   Adjustment disorder with mixed disturbance of emotions and conduct   Total Time spent with patient: 45 minutes  Musculoskeletal: Strength & Muscle Tone: within normal limits Gait & Station: normal Patient leans: N/A  Psychiatric Specialty Exam:   Blood pressure (!) 155/89, pulse 69, temperature 97.9 F (36.6 C), resp. rate 20, SpO2 92 %.There is no height or weight on file to calculate BMI.  General Appearance: Casual  Eye Contact::  Good  Speech:  NA, deaf  Volume:  non-verbal  Mood:  Anxious  Affect:  Congruent  Thought Process:  Coherent and Descriptions of Associations: Intact  Orientation:  Full (Time, Place, and Person)  Thought Content:  WDL and Logical  Suicidal Thoughts:  No  Homicidal Thoughts:  No  Memory:  Immediate;   Good Recent;   Good Remote;   Good  Judgement:  Fair  Insight:  Fair  Psychomotor Activity:  Normal  Concentration:  Good  Recall:  Good  Fund of Knowledge:Fair  Language: Good  Akathisia:  No  Handed:  Right  AIMS (if indicated):     Assets:  Leisure Time Resilience Social Support  Sleep:     Cognition: WNL  ADL's:  Impaired   Mental Status Per Nursing Assessment::   On Admission:   47 yo female who presented to the ED after an argument escalated with her roommate to aggression.  When her roommate kicked her out, she was upset and stated suicidal ideations.  On assessment, she is calm and cooperative with no suicidal/homicidal ideations, hallucinations, and substance abuse.  Stable for discharge, social work consult placed as she is upset about not having a place to live and her stuff and money are at her apartment where she got kicked out.  Demographic Factors:  NA  Loss Factors: NA  Historical Factors: NA  Risk  Reduction Factors:   Sense of responsibility to family, Living with another person, especially a relative and Positive social support  Continued Clinical Symptoms:  Anxiety, mild  Cognitive Features That Contribute To Risk:  None    Suicide Risk:  Minimal: No identifiable suicidal ideation.  Patients presenting with no risk factors but with morbid ruminations; may be classified as minimal risk based on the severity of the depressive symptoms    Plan Of Care/Follow-up recommendations:  Activity:  as tolerated Diet:  heart healthy diet  , , NP 04/15/2018, 10:55 AM

## 2018-04-15 NOTE — Discharge Instructions (Signed)
For your behavioral health needs you are advised to follow up with [facility].  New patients are seen at their walk-in clinic.  Walk-in hours are Monday - Friday from 8:00 am - 12:00 pm, and from 1:00 pm - 3:00 pm.  Walk-in patients are seen on a first come, first served basis, so try to arrive as early as possible for the best chance of being seen the same day.  There is an initial fee of $22.50: ° °     [facility] °     [address] °     [place], [place] [address] °     [phone] °

## 2018-04-15 NOTE — Progress Notes (Addendum)
CSW called [facility], [employer] arrived and pt left with taxi.  CSW called non-emergency dispatch and provided pt's identifying information, what she is wearing, handicaps, etc and then provided the number the deputy would need to contact the pt's roommate's phone at ph: [phone] for pt's/pt's roommate's interpreter (roommate is deaf also).  Dispatch voiced understanding and stated they will send a deputy ASAP to meet the pt at her home to assist the pt in retrieving her belongings.  Pt stated she will likely go to a shelter once she obtains her I.D, "money card" and money, per the pt.  CSW will continue to follow for D/C needs.  Dorothe PeaJonathan F. Braedon Sjogren, LCSW, LCAS, CSI Clinical Social Worker Ph: [phone]      +

## 2018-04-15 NOTE — Progress Notes (Signed)
CSW spoke with Sandra Roth and was informed that pt has been seen and cleared by psych. CSW was advised that pt I deaf and would benefit from face to face communication as opposed to phone. CSW advised Sandra Roth that [facility] is not [facility] and would see if ED Evening  CSW would be able to handle this case. Tom expressed understanding. Plan is for 2nd shift ED CSW to follow up with pt for further resources as needed.     Claude MangesKierra S. Charissa Knowles, MSW, LCSW-A Emergency Department Clinical Social Worker [phone]

## 2018-04-15 NOTE — ED Triage Notes (Signed)
Pt deaf, sign interpreter used. Pt reports her shelter is full and she has no place to sleep. Pt also reports abd pain and she is diabetic and has not been able to take her medications. Pt reports she needs dialysis tomorrow but is concerned about how she will get there. Last dialysis Saturday.

## 2018-04-15 NOTE — Progress Notes (Addendum)
2nd shift ED CSW received a handoff from the 1st shift [facility] ED CSW.   Pt needs transport, per 1st shift ED CSW.  CSW spoke to pt who was "kicked out of her apartment by my rommate", per pt.  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  CSW spoke to pt via language interpreter.  CSW offered to send pt via taxi to her home with [employer] escort to retrieve her belongings and pt then stated,  "What then?", via interpreter.  Pt gave CSW verbal permission to speak to her brother at ph: [phone].  CSW called pt's brother who stated he will be at work until [employer]am and cannot p/u the pt but that there is someone at his house who can let the pt in and pt can remain there until morning.  Pt stated she had to p/u her [place] [facility] [place] [facility] to go to either her brother's house or to a shelter.  CSW will offer the pt a choice of going to the shelter via taxi or to her former home in [place] at:  [address] [place], [place] [id number]  Pt gave verbal permission to call her former roommate Burnett HarryShelly and pt asked that CSW call Horatio PelShelly Smith at ph: [phone] to let her know pt will be arriving to `p/u her belongings and will be escorted [facility]".  CSW spoke to Dwan BoltMiss Smith, who is also deaf, via a phone interpreter and Miss Katrinka BlazingSmith asked that the Sheriff's Deputy call [phone] when the [facility] arrives so the deputy can call the number to let the deaf occupant know that the pt/Sheriff's Deputy has arrived and is outside.  CSW reviewed notes and sees pt's IVC has been rescinded.  AT D/C pt was presenting as emotional when pt realized she would not be provided with a taxi voucher to go to her brother's house [place], but was told by CSW that those multiple funds/voucher's  are not possible that the CSW can only provide pt a one-way voucher.  Pt voiced understanding via the interpreter .   Py again became emotional when receiving instructions  via pt's RN and repeated the above and the CSW repeated the above to the pt again via interpreter.  again.  CSW called non-emergency dispatch who confirmed that they can request a deputy meet pt when pt D/C'd.  CSW will call back when pt leaves with the taxi to request [facility].  CSW called [facility] and taxi will cost $28, per B.B.  CSW will continue to follow for D/C needs.  Dorothe PeaJonathan F. Jeiden Daughtridge, LCSW, LCAS, CSI Clinical Social Worker Ph: [phone]

## 2018-04-15 NOTE — ED Notes (Signed)
Blue top tube drawn on pt sent to main lab

## 2018-04-15 NOTE — ED Notes (Signed)
Pt was discharged from unit after receiving discharge instructions and belongings. Discharge instructions were discussed via [place] interpreter and pt indicated understanding. Pt signed for discharge and left unit for a taxi. A police

## 2018-04-16 NOTE — ED Notes (Addendum)
Pt called for vitals no answer. °

## 2018-04-16 NOTE — ED Provider Notes (Signed)
[facility] Provider Note   CSN: [id number] Arrival date & time: 04/15/18  1934     History   Chief Complaint Chief Complaint  Patient presents with  . Abdominal Pain    HPI Sandra Roth is a 47 y.o. female.   Abdominal Pain   This is a chronic problem. The current episode started more than 1 week ago. The problem occurs hourly. The problem has been resolved. The pain is associated with eating. The pain is located in the generalized abdominal region. The pain is moderate. Pertinent negatives include diarrhea, vomiting and constipation. Nothing aggravates the symptoms. Nothing relieves the symptoms. Past workup does not include CT scan. Her past medical history is significant for GERD. Her past medical history does not include PUD.    Past Medical History:  Diagnosis Date  . 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 Diabetes mellitus without complication (HCC)   . Renal disorder    Dialysis patient    Patient Active Problem List   Diagnosis Date Noted  . Adjustment disorder with mixed disturbance of emotions and conduct 04/15/2018  . 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 HYPERTENSION, BENIGN 02/22/2009  . PERICARDIAL EFFUSION 02/22/2009  . Chronic diastolic heart failure (HCC) 02/22/2009  . CONSTIPATION 02/22/2009    History reviewed. No pertinent surgical history.   OB History   No obstetric history on file.      Home Medications    Prior to Admission medications   Not on File    Family History No family history on file.  Social History Social History   Tobacco Use  . Smoking status: Never Smoker  . Smokeless tobacco: Never Used  Substance Use Topics  . Alcohol use: Never    Frequency: Never  . Drug use: Never     Allergies   Diphenhydramine-acetaminophen; Aspirin; Ibuprofen; Tape; and Naproxen sodium   Review of Systems Review of Systems  Gastrointestinal: Positive for abdominal pain. Negative  for constipation, diarrhea and vomiting.  All other systems reviewed and are negative.    Physical Exam Updated Vital Signs BP (!) 155/87 (BP Location: Right Arm)   Pulse 68   Temp 98 F (36.7 C) (Oral)   Resp 16   LMP  (LMP Unknown) Comment: pt signed waiver  SpO2 96%   Physical Exam Vitals signs and nursing note reviewed.  Constitutional:      Appearance: She is well-developed.  HENT:     Head: Normocephalic and atraumatic.  Neck:     Musculoskeletal: Normal range of motion.  Cardiovascular:     Rate and Rhythm: Normal rate and regular rhythm.  Pulmonary:     Effort: Pulmonary effort is normal. No respiratory distress.     Breath sounds: Normal breath sounds. No stridor.  Abdominal:     General: Abdomen is flat. Bowel sounds are normal. There is no distension.     Tenderness: There is no abdominal tenderness.     Hernia: No hernia is present.  Neurological:     Mental Status: She is alert.      ED Treatments / Results  Labs (all labs ordered are listed, but only abnormal results are displayed) Labs Reviewed  COMPREHENSIVE METABOLIC PANEL - Abnormal; Notable for the following components:      Result Value   Potassium 5.2 (*)    Chloride 95 (*)    Glucose, Bld 120 (*)    BUN 61 (*)    Creatinine, Ser 8.78 (*)  Calcium 8.2 (*)    Total Protein 8.2 (*)    GFR calc non Af Amer 5 (*)    GFR calc Af Amer 6 (*)    Anion gap 16 (*)    All other components within normal limits  CBC - Abnormal; Notable for the following components:   RBC 3.56 (*)    Hemoglobin 9.6 (*)    HCT 32.7 (*)    MCHC 29.4 (*)    RDW 17.6 (*)    All other components within normal limits  I-STAT BETA HCG BLOOD, ED ([facility], [facility], [facility] ONLY) - Abnormal; Notable for the following components:   I-stat hCG, quantitative 11.0 (*)    All other components within normal limits  LIPASE, BLOOD    EKG None  Radiology Dg Chest 2 View  Result Date: 04/14/2018 CLINICAL DATA:  Dialysis patient EXAM:  CHEST - 2 VIEW COMPARISON:  04/05/2018 FINDINGS: Cardiomegaly with vascular congestion and bilateral airspace opacities likely edema. No visible effusions or acute bony abnormality. IMPRESSION: Mild edema/CHF. Electronically Signed   By: Charlett Nose M.D.   On: 04/14/2018 23:15    Procedures Procedures (including critical care time)  Medications Ordered in ED Medications - No data to display   Initial Impression / Assessment and Plan / ED Course  I have reviewed the triage vital signs and the nursing notes.  Pertinent labs & imaging results that were available during my care of the patient were reviewed by me and considered in my medical decision making (see chart for details).     Patient here approximate 6 hours after just being discharged for exact same symptoms.  States she had MRIs, CT scans and multiple other work-up for abdominal pain in the past. It is likely related to reflux, needs to see GI. No emergent workup indicated.   She seems to be really here because she does not have a shelter anywhere to go to sleep as she references this many times.  She also states she needs dialysis later today however here her labs do not have any indication for emergent dialysis.  Patient request to speak to social work however she is medically clear at this time for discharge I will leave it to nursing for appropriate place to wait for social work consultation.  Final Clinical Impressions(s) / ED Diagnoses   Final diagnoses:  Homeless  Abdominal bloating    ED Discharge Orders    None       Dreya Buhrman, Barbara Cower, MD 04/16/18 [phone]

## 2018-04-16 NOTE — ED Notes (Signed)
Patient is Deaf and is sitting in a blue wheel chair near the bathrooms. Patient is wearing a red jacket.

## 2018-04-16 NOTE — Progress Notes (Signed)
CSW consulted as pt is homeless and [facility]. CSW spoke with pt at bedside with Joy interpretor.CSW was informed by pt that [place] o ly [place]'s brother Arlys JohnBrian is. CSW received permission to speak with Arlys JohnBrian. CSW [place] to [place] at [place] listed in note. CSW unable to speak  with Arlys JohnBrian as phone is off. CSW was able to leave voicemail asking that he call CSW back.   CSW spoke with pt to establish another plan. CS[facility] and pt would atleast be able to remain dry during the day. Pt was agreeable to this and then expressed that CS[facility] offered pt bus pass [facility] [place]. CSW advised pt that from CSW's understanding PART passes are no longer given therefore if pt wanted to get [facility] [place] with [facility] pt would have to pay for it. Pt understanding and mentioned that if pt can get to downtown then [place]   CSW notified pt that it was rainingoutside and pt then changed mind and wanted to go back [facility]. [facility] the [facility] and then go tot the bus station to get [place]'s [place] CSW sent note with pt asking that staff at the [facility] call pt's brother again.    No further  CSW needs. CSW signing off as pt has been given [facility] at [facility].      Claude MangesKierra S. Quanna Wittke, MSW, LCSW-A Emergency Department Clinical Social Worker [phone]

## 2018-04-16 NOTE — ED Notes (Signed)
Called Patient to reassess vitals x3 and had no answer. 

## 2018-04-19 NOTE — Progress Notes (Unsigned)
CSW received call from pt's dialysis CSW seeking further information [place] [facility] [place] [facility] [place] [facility].  CSW advised social worker that when meeting with pt at bedside pt expressed that she was [place] [facility] [place] and then changed mind and asked to [facility] since it was raining outside. CSW advised Social worker that CSW provided pt with [facility] and gave pt bus pass if pt chose to proceed with [place] [facility] . CSW advised social worker that CSW was unable to contact brother and asked that staff from [facility] keep trying in order to assist pt. CSW unsure if pt went to bus station as CSW had interpretor clarify to pt that [facility] was [facility] and pt expressed that she was going there to reach brother. CSW provided Social worker with number for brother.    Claude MangesKierra S. Rawlin Reaume, MSW, LCSW-A Emergency Department Clinical Social Worker [phone]

## 2018-04-26 ENCOUNTER — Ambulatory Visit: Payer: Self-pay | Admitting: Podiatry

## 2018-05-06 ENCOUNTER — Ambulatory Visit: Payer: Medicare Other | Admitting: Internal Medicine

## 2021-01-29 DEATH — deceased
